# Patient Record
Sex: Female | Born: 1975
Health system: Southern US, Community
[De-identification: ages and names within clinical notes are randomized; demographics above are authoritative.]

## PROBLEM LIST (undated history)

## (undated) DIAGNOSIS — Z9889 Other specified postprocedural states: Secondary | ICD-10-CM

## (undated) DIAGNOSIS — D649 Anemia, unspecified: Secondary | ICD-10-CM

## (undated) DIAGNOSIS — Z974 Presence of external hearing-aid: Secondary | ICD-10-CM

## (undated) DIAGNOSIS — R112 Nausea with vomiting, unspecified: Secondary | ICD-10-CM

## (undated) DIAGNOSIS — N946 Dysmenorrhea, unspecified: Secondary | ICD-10-CM

## (undated) DIAGNOSIS — Z973 Presence of spectacles and contact lenses: Secondary | ICD-10-CM

## (undated) DIAGNOSIS — N819 Female genital prolapse, unspecified: Secondary | ICD-10-CM

## (undated) DIAGNOSIS — Z8489 Family history of other specified conditions: Secondary | ICD-10-CM

## (undated) DIAGNOSIS — G43909 Migraine, unspecified, not intractable, without status migrainosus: Secondary | ICD-10-CM

## (undated) DIAGNOSIS — N920 Excessive and frequent menstruation with regular cycle: Secondary | ICD-10-CM

## (undated) HISTORY — PX: OTHER SURGICAL HISTORY: SHX169

---

## 1999-11-18 ENCOUNTER — Other Ambulatory Visit: Admission: RE | Admit: 1999-11-18 | Discharge: 1999-11-18 | Payer: Self-pay | Admitting: Family Medicine

## 2000-12-31 ENCOUNTER — Other Ambulatory Visit: Admission: RE | Admit: 2000-12-31 | Discharge: 2000-12-31 | Payer: Self-pay | Admitting: Obstetrics and Gynecology

## 2001-03-25 ENCOUNTER — Ambulatory Visit (HOSPITAL_COMMUNITY): Admission: RE | Admit: 2001-03-25 | Discharge: 2001-03-25 | Payer: Self-pay | Admitting: Obstetrics and Gynecology

## 2001-03-25 ENCOUNTER — Encounter: Payer: Self-pay | Admitting: Obstetrics and Gynecology

## 2001-07-13 ENCOUNTER — Inpatient Hospital Stay (HOSPITAL_COMMUNITY): Admission: AD | Admit: 2001-07-13 | Discharge: 2001-07-13 | Payer: Self-pay | Admitting: Obstetrics and Gynecology

## 2001-07-25 ENCOUNTER — Ambulatory Visit (HOSPITAL_COMMUNITY): Admission: RE | Admit: 2001-07-25 | Discharge: 2001-07-25 | Payer: Self-pay | Admitting: Obstetrics and Gynecology

## 2001-08-01 ENCOUNTER — Inpatient Hospital Stay (HOSPITAL_COMMUNITY): Admission: AD | Admit: 2001-08-01 | Discharge: 2001-08-04 | Payer: Self-pay | Admitting: Obstetrics and Gynecology

## 2002-02-17 ENCOUNTER — Other Ambulatory Visit: Admission: RE | Admit: 2002-02-17 | Discharge: 2002-02-17 | Payer: Self-pay | Admitting: *Deleted

## 2003-02-02 ENCOUNTER — Ambulatory Visit (HOSPITAL_COMMUNITY): Admission: RE | Admit: 2003-02-02 | Discharge: 2003-02-02 | Payer: Self-pay | Admitting: Family Medicine

## 2003-02-02 ENCOUNTER — Encounter: Payer: Self-pay | Admitting: Family Medicine

## 2003-02-13 ENCOUNTER — Other Ambulatory Visit: Admission: RE | Admit: 2003-02-13 | Discharge: 2003-02-13 | Payer: Self-pay | Admitting: Obstetrics and Gynecology

## 2003-03-08 ENCOUNTER — Ambulatory Visit (HOSPITAL_COMMUNITY): Admission: RE | Admit: 2003-03-08 | Discharge: 2003-03-08 | Payer: Self-pay | Admitting: Obstetrics and Gynecology

## 2004-03-21 ENCOUNTER — Other Ambulatory Visit: Admission: RE | Admit: 2004-03-21 | Discharge: 2004-03-21 | Payer: Self-pay | Admitting: Family Medicine

## 2007-01-11 ENCOUNTER — Other Ambulatory Visit: Admission: RE | Admit: 2007-01-11 | Discharge: 2007-01-11 | Payer: Self-pay | Admitting: Family Medicine

## 2007-10-30 ENCOUNTER — Emergency Department (HOSPITAL_COMMUNITY): Admission: EM | Admit: 2007-10-30 | Discharge: 2007-10-30 | Payer: Self-pay | Admitting: Emergency Medicine

## 2008-12-12 ENCOUNTER — Emergency Department (HOSPITAL_COMMUNITY): Admission: EM | Admit: 2008-12-12 | Discharge: 2008-12-12 | Payer: Self-pay | Admitting: Emergency Medicine

## 2008-12-24 ENCOUNTER — Other Ambulatory Visit: Admission: RE | Admit: 2008-12-24 | Discharge: 2008-12-24 | Payer: Self-pay | Admitting: Family Medicine

## 2011-01-22 LAB — URINALYSIS, ROUTINE W REFLEX MICROSCOPIC
Bilirubin Urine: NEGATIVE
Glucose, UA: NEGATIVE mg/dL
Hgb urine dipstick: NEGATIVE
Ketones, ur: NEGATIVE mg/dL
Nitrite: NEGATIVE
Protein, ur: NEGATIVE mg/dL
Specific Gravity, Urine: 1.005 (ref 1.005–1.030)
Urobilinogen, UA: 0.2 mg/dL (ref 0.0–1.0)
pH: 7 (ref 5.0–8.0)

## 2011-01-22 LAB — DIFFERENTIAL
Basophils Absolute: 0 10*3/uL (ref 0.0–0.1)
Basophils Relative: 0 % (ref 0–1)
Eosinophils Absolute: 0.1 10*3/uL (ref 0.0–0.7)
Neutrophils Relative %: 74 % (ref 43–77)

## 2011-01-22 LAB — POCT I-STAT, CHEM 8
Creatinine, Ser: 0.9 mg/dL (ref 0.4–1.2)
Glucose, Bld: 91 mg/dL (ref 70–99)
Hemoglobin: 12.9 g/dL (ref 12.0–15.0)
TCO2: 24 mmol/L (ref 0–100)

## 2011-01-22 LAB — URINE MICROSCOPIC-ADD ON

## 2011-01-22 LAB — CBC
MCHC: 33.4 g/dL (ref 30.0–36.0)
MCV: 88.5 fL (ref 78.0–100.0)
Platelets: 314 10*3/uL (ref 150–400)

## 2011-01-22 LAB — POCT CARDIAC MARKERS: Myoglobin, poc: 55.5 ng/mL (ref 12–200)

## 2011-02-27 NOTE — Op Note (Signed)
NAME:  April Sutton, April Sutton                         ACCOUNT NO.:  [id number]   MEDICAL RECORD NO.:  [id number]                   PATIENT TYPE:  AMB   LOCATION:  SDC                                  FACILITY:  [facility]   PHYSICIAN:  Miguel Aschoff, M.D.                    DATE OF BIRTH:  20-May-1976   DATE OF PROCEDURE:  03/08/2003  DATE OF DISCHARGE:                                 OPERATIVE REPORT   PREOPERATIVE DIAGNOSIS:  Secondary dysmenorrhea.   POSTOPERATIVE DIAGNOSIS:  Pelvic endometriosis.   PROCEDURE:  Diagnostic laparoscopy with laser ablation of endometriosis and  laser uterosacral nerve ablation.   SURGEON:  Miguel Aschoff, M.D.   ANESTHESIA:  General.   COMPLICATIONS:  None.   JUSTIFICATION:  The patient is a 35 year old white female with a history of  secondary dysmenorrhea getting progressively worse, which has not responded  to medical therapy.  In view of failure of medical therapy and a desire for  a diagnosis, the patient presents now to undergo laparoscopy to see if an  etiology for the pain can be established and corrected.  The risks and  benefits of the procedure were discussed with the patient.   DESCRIPTION OF PROCEDURE:  The patient was taken to the operating room and  placed in a supine position.  General anesthesia was administered without  difficulty.  She was then placed in the dorsal lithotomy position, prepped  and draped in the usual sterile fashion.  The bladder was catheterized, a  Hulka tenaculum was placed through the cervix and held.  Once this was done  a small infraumbilical incision was made, a Veress needle was inserted, and  the abdomen was insufflated with 3 L of CO2.  Following the insufflation the  trocar for the laparoscope was placed, followed by the laparoscope itself.  Under direct visualization a 5 mm port was then established suprapubically.  Systematic inspection of pelvic organs showed the anterior bladder  peritoneum to be unremarkable.   The uterus was noted to be anterior, normal  size and shape, and unremarkable.  The round ligaments were inspected and  were totally within normal limits.  the tubes were traced out to their  fimbriated ends.  The tubes were normal in their course, the fimbriae were  fine and delicate.  The ovaries were inspected and were noted to be totally  within normal limits bilaterally.  No cysts or lesions were noted within the  ovary.  Inspection of the cul-de-sac revealed one pigmented characteristic  lesion of endometriosis just beyond the right uterosacral ligament; however,  in the cul-de-sac there were multiple white plaques and scarring also  consistent with nonpigmented endometriosis.  The intestinal surfaces  appeared unremarkable.  The liver was inspected and noted to be within  normal limits, and no other abnormalities were noted in the pelvis or  abdomen.  At this point in an  effort to decrease the patient's pain, a YAG  fiber with a GRP6 YAG tip was placed through the operating channel of the  laparoscope and then the uterosacral ligaments were partially transected in  an effort to decrease the dysmenorrhea.  Care was taken to avoid any injury  to the ureter or any blood vessels.  The pigmented endometrial lesion was  treated with the YAG laser without difficulty.  Once this was done and there  was good hemostasis and no other abnormalities were noted, it was elected to  complete the  procedure.  The CO2 was allowed to escape, all instruments  were removed.  The small incisions were closed using subcuticular 4-0  Vicryl.  The port sites were injected with 0.5% Marcaine.  The patient was  reversed from the anesthetic and taken to the recovery room in satisfactory  condition.  The estimated blood loss was less than 10 mL.   The plan is for the patient to return to the office in four weeks for follow-  up examination.  She is to call if there are any problems such as fever,  pain, or heavy  bleeding.  Medications for home include Tylox one every three  hours as needed for pain, doxycycline 100 mg twice a day x3 days.  She is to  call if there are any problems such as fever, pain, or heavy bleeding, and  was instructed to refrain from intercourse for two weeks.                                               Miguel Aschoff, M.D.    AR/MEDQ  D:  03/08/2003  T:  03/08/2003  Job:  [id number]

## 2011-02-27 NOTE — H&P (Signed)
[facility]  Patient:    April Sutton, April Sutton Visit Number: [id number] MRN: [id number]          Service Type: OBS Location: 910B 9160 01 Attending Physician:  Shaune Spittle Dictated by:   Saverio Danker, C.N.M. Admit Date:  08/01/2001                           History and Physical  HISTORY:                      Ms. Strickland is a 35 year old married white female gravida 3, para 2-0-0-2 at [redacted] weeks gestation by ultrasound who presents for induction of labor secondary to being post dates and having significant left leg swelling.  She denies any nausea, vomiting, headaches, or visual disturbances.  She denies any leaking or vaginal bleeding.  She actually currently denies any uterine contractions.  Her pregnancy has been followed by [facility] by the certified nurse midwife service and has been essentially uncomplicated, though at risk for being post dates, questionable LMP, first trimester spotting, group B Strep negative.  PAST OBSTETRICAL HISTORY:     She is a gravida 3, para 2-0-0-2 who delivered a viable female infant in August 1995 who weighed 7 pounds 0 ounces at [redacted] weeks gestation following a 36 hour labor.  That was an infant named Paramedic.  In December 1996 she delivered a viable female infant who weighed 8 pounds 8 ounces at [redacted] weeks gestation following a four and a half hour labor, also vaginally with no complications.  That infants name is Dedrick.  ALLERGIES:                    No known drug allergies.  PAST MEDICAL HISTORY:         She reports having had the usual childhood diseases.  She reports occasional urinary tract infections and had a kidney infection that was treated with p.o. antibiotics.  She has a history of a seizure secondary to increased temperature as a child and stress related migraines.  FAMILY HISTORY:               Significant for maternal grandmother with MI, paternal grandmother with  varicosities, maternal grandmother with insulin-dependent diabetes now deceased, maternal grandfather with stroke and cancer, paternal grandfather and maternal grandmother with CVA.  GENETIC HISTORY:              Significant only for patients maternal second cousin with Down syndrome and father of the babys uncle had some kind of heart problem.  SOCIAL HISTORY:               She is married to [employer] who is involved and supportive.  He is employed full-time as a Nature conservation officer.  She is employed full-time as a Diplomatic Services operational officer.  They are of the [place] faith.  They deny any illicit drug use, alcohol, or smoking with this pregnancy.  PRENATAL LABORATORIES:        Blood type A+.  Antibody screen is negative. Syphilis is nonreactive.  Rubella is immune.  Hepatitis B surface antigen is negative.  HIV is nonreactive.  Pap is within normal limits.  One hour glucola is within normal range.  Maternal serum alpha fetoprotein is also within normal range.  Her 36 week beta Strep was negative.  She had an ultrasound at approximately 38 weeks that gave estimated fetal weight of  82-87th percentile growth.  PHYSICAL EXAMINATION  VITAL SIGNS:                  Stable.  She is afebrile.  HEENT:                        Grossly within normal limits.  HEART:                        Regular rhythm and rate.  CHEST:                        Clear.  BREASTS:                      Soft and nontender.  ABDOMEN:                      Gravid with uterine contractions every five minutes that are mild.  Fetal heart rate is reassuring.  PELVIC:                       Cervix is 1 cm, 50%, vertex, -2/-3, very posterior.  EXTREMITIES:                  Left leg has 2+ edema, right leg has 1+ edema.  ASSESSMENT:                   1. Intrauterine pregnancy at 41 weeks for                                  induction of labor.                               2. Negative group B Strep.  PLAN:                         In  consultation with Dr. Dierdre Forth to start her on Pitocin throughout the day.  If she has no significant progress by this evening, will plan Cervidil overnight. Dictated by:   Vance Gather Duplantis, C.N.M. Attending Physician:  Shaune Spittle DD:  08/01/01 TD:  08/01/01 Job: [id number]

## 2011-03-25 ENCOUNTER — Encounter (INDEPENDENT_AMBULATORY_CARE_PROVIDER_SITE_OTHER): Payer: 59 | Admitting: Obstetrics & Gynecology

## 2011-03-25 ENCOUNTER — Other Ambulatory Visit: Payer: Self-pay | Admitting: Obstetrics & Gynecology

## 2011-03-25 DIAGNOSIS — R102 Pelvic and perineal pain: Secondary | ICD-10-CM

## 2011-03-25 DIAGNOSIS — Z01419 Encounter for gynecological examination (general) (routine) without abnormal findings: Secondary | ICD-10-CM

## 2011-03-26 NOTE — Assessment & Plan Note (Unsigned)
April Sutton, April Sutton               ACCOUNT NO.:  [id number]  MEDICAL RECORD NO.:  [id number]            PATIENT TYPE:  LOCATION:  [facility] [place]           FACILITY:  PHYSICIAN:  Allie Bossier, MD             DATE OF BIRTH:  DATE OF SERVICE:  03/25/2011                                 CLINIC NOTE  HISTORY OF PRESENT ILLNESS:  April Sutton is a 35 year old married white G3, P3, she has a 63 year old daughter, 80 year old son, and a 9-year- old daughter and she is here as a new patient exam complaining of pelvic pain and the pain is significant all during the month, but especially bad with her period.  She tells me she had a laparoscopy in 2004 and was told that she has endometriosis.  She said her pain was fairly well controlled until about 6 months ago at which time it became worse.  She complains of an itchy vagina, but was checked recently.  Her primary care doctor told she did not have a yeast infection.  PAST MEDICAL HISTORY: 1. Migraines. 2. Overweight. 3. Endometriosis.  REVIEW OF SYSTEMS:  She will have about 10 to 12 periods a year.  Her husband is status post vasectomy.  She had been married for 16 years. Her primary care MD is at [facility] and she does report dysmenorrhea and dyspareunia.  Her Pap smears have always been normal. Her last was done in 2011 and she understands that she will need another one for 2 more years.  PAST SURGICAL HISTORY:  Laparoscopy and excision of endometriosis in 2004 (I will try to obtain these records).  FAMILY HISTORY:  Significant for colon cancer in a 36 year old maternal cousin, significant for brain cancer in a 36 year old paternal cousin, and significant for breast cancer in a maternal aunt.  She denies family history of GYN cancer.  She has LATEX allergies.  No known drug allergies.  SOCIAL HISTORY:  Negative for tobacco, alcohol, or drug use.  MEDICATIONS:  Butal/Fioricet on a p.r.n. basis for her migraine.  She takes  ibuprofen for her pelvic pain on a p.r.n. basis and she takes B pollen on a daily basis (over-the-counter supplement).  PHYSICAL EXAMINATION:  GENERAL:  Well-nourished, well-hydrated pleasant white female, height 5 feet 3 inches, weight 180, blood pressure 119/75, pulse 92. HEENT:  Normal. BREASTS:  Normal bilaterally. HEART:  Regular rate and rhythm. LUNGS:  Clear to auscultation bilaterally. ABDOMEN:  Obese, benign. EXTERNAL GENITALIA:  No lesions.  Normal discharge.  Normal vagina. Normal vulva.  Cervix appears parous, but no lesions.  Uterus is mid plane, is upper limit of normal size to 6 weeks' size.  It is relatively mobile and not particularly tender.  Adnexa nonenlarged and nontender.  ASSESSMENT/PLAN: 1. Annual exam.  She does not need a Pap smear at this point, but I     recommend self-breast and self-vulvar exams. 2. Increasing pelvic pain and dyspareunia and slightly enlarged     uterus.  Her pain maybe due to return of her endometriosis, but I     also would like to rule out a fibroid, so I am ordering an  ultrasound.  I have checked cervical cultures and I will see her     back when her ultrasound is finished.     Allie Bossier, MD    MCD/MEDQ  D:  03/25/2011  T:  03/26/2011  Job:  [id number]

## 2011-03-27 ENCOUNTER — Ambulatory Visit (HOSPITAL_COMMUNITY)
Admission: RE | Admit: 2011-03-27 | Discharge: 2011-03-27 | Disposition: A | Discharge status: Home / Self Care | Payer: 59 | Source: Ambulatory Visit | Attending: Obstetrics & Gynecology | Admitting: Obstetrics & Gynecology

## 2011-03-27 ENCOUNTER — Other Ambulatory Visit (HOSPITAL_COMMUNITY): Payer: 59

## 2011-03-27 DIAGNOSIS — N949 Unspecified condition associated with female genital organs and menstrual cycle: Secondary | ICD-10-CM | POA: Insufficient documentation

## 2011-03-27 DIAGNOSIS — R102 Pelvic and perineal pain: Secondary | ICD-10-CM

## 2011-04-22 ENCOUNTER — Ambulatory Visit: Payer: 59 | Admitting: Obstetrics & Gynecology

## 2011-04-23 ENCOUNTER — Telehealth: Payer: Self-pay | Admitting: *Deleted

## 2011-04-23 ENCOUNTER — Encounter: Payer: Self-pay | Admitting: *Deleted

## 2011-04-23 NOTE — Telephone Encounter (Signed)
Pt called requesting results of U/S results.

## 2011-04-23 NOTE — Telephone Encounter (Signed)
Spoke with pt and let her know that U/S was normal but she needed to sch appt with Dr Marice Potter to re-evaluate problem and make a plan of care.  Pt agrres

## 2011-04-28 ENCOUNTER — Encounter: Payer: Self-pay | Admitting: Obstetrics & Gynecology

## 2011-04-28 ENCOUNTER — Ambulatory Visit (INDEPENDENT_AMBULATORY_CARE_PROVIDER_SITE_OTHER): Payer: 59 | Admitting: Obstetrics & Gynecology

## 2011-04-28 VITALS — BP 132/81 | HR 92 | Temp 97.8°F | Resp 16 | Ht 63.0 in | Wt 177.0 lb

## 2011-04-28 DIAGNOSIS — R32 Unspecified urinary incontinence: Secondary | ICD-10-CM

## 2011-04-28 DIAGNOSIS — N949 Unspecified condition associated with female genital organs and menstrual cycle: Secondary | ICD-10-CM

## 2011-04-28 DIAGNOSIS — R102 Pelvic and perineal pain unspecified side: Secondary | ICD-10-CM

## 2011-04-28 NOTE — Progress Notes (Signed)
  Subjective:    Patient ID: April Sutton, female    DOB: [birth year], 35 y.o.   MRN: [id number]  HPI Pt is a 35 year old G3P3 female with a month history of pelvic pain.  Pt has dysmenorrhea and dyspaurenia.  The pt states she was diagnosed with endometriosis in 2004 by laparoscopy and fulguration of the endometriosis.  Pain was well controlled until Dec 2011.  Pain is mostly constant across abdomen and worse with menses.  Pt does have frequent urination and voids approx every 1-2 hrs.  Pt fluid restricts on trips and during movies. Pt voids approx 1-2 times per night.  There is discomfort to a full bladder 4-5 times a day.  Pt has 2 BMs a day which a soft and nonpainful.  Pt has been on OCPs for one month with little improvement in pain.  We discussed continual OCPs which she will try.  Pt may need a monophasic pill for better cycle control.  Pt has a nml [place] from last month.  Pt needs to sign record release for surgical reports from 2004    Review of Systems  Constitutional: Negative.   Respiratory: Negative.   Cardiovascular: Negative.   Gastrointestinal:       Pain across both lower quadrants  Genitourinary: Negative.   Psychiatric/Behavioral: Negative for agitation.       Objective:   Physical Exam  Constitutional: She appears well-developed and well-nourished.  HENT:  Head: Normocephalic.  Pulmonary/Chest: Effort normal.  Skin: Skin is warm and dry.  Psychiatric: She has a normal mood and affect. Her behavior is normal.      Assessment & Plan:  Pt is a 35 y.o. Female with 6 months of pelvic pain, dysmenorrhea, and dyspaurenia and dx with endometriosis 8 yrs ago.  Pt also has urinary incont.  ?mixed incontinence.  1.  Cont OCPs.  May need monphasic pills if pt skips placebo.  If pain not better, may switch to aromatase inhibitor and new prog.  2.  Urinary incont--voiding diary for 2 days, including intake.  Referral to PT for incont and pelvci pain.  3.  Obtain 2004 op  note.  4.  RTC in 2-3 months.

## 2011-04-28 NOTE — Patient Instructions (Signed)
Take birth control continuously.  Do not take placebo pills. Voiding dairy for 2 days.  Write down everything you drink and when you void.  Include amts and times. Physical Therapy for incontinence Release of information Breast Cancer gene testing.

## 2011-04-30 ENCOUNTER — Encounter: Payer: Self-pay | Admitting: Obstetrics & Gynecology

## 2011-06-17 ENCOUNTER — Ambulatory Visit: Payer: 59 | Admitting: Obstetrics & Gynecology

## 2011-06-24 ENCOUNTER — Encounter: Payer: Self-pay | Admitting: Obstetrics & Gynecology

## 2011-06-24 ENCOUNTER — Ambulatory Visit (INDEPENDENT_AMBULATORY_CARE_PROVIDER_SITE_OTHER): Payer: 59 | Admitting: Obstetrics & Gynecology

## 2011-06-24 VITALS — BP 134/80 | HR 80 | Temp 98.6°F | Resp 16 | Ht 63.0 in | Wt 182.0 lb

## 2011-06-24 DIAGNOSIS — R102 Pelvic and perineal pain: Secondary | ICD-10-CM

## 2011-06-24 DIAGNOSIS — N949 Unspecified condition associated with female genital organs and menstrual cycle: Secondary | ICD-10-CM

## 2011-06-24 DIAGNOSIS — R32 Unspecified urinary incontinence: Secondary | ICD-10-CM

## 2011-06-24 DIAGNOSIS — N9089 Other specified noninflammatory disorders of vulva and perineum: Secondary | ICD-10-CM | POA: Insufficient documentation

## 2011-06-24 DIAGNOSIS — N809 Endometriosis, unspecified: Secondary | ICD-10-CM | POA: Insufficient documentation

## 2011-06-24 LAB — POCT URINALYSIS DIPSTICK
Blood, UA: NEGATIVE
Glucose, UA: NEGATIVE
Spec Grav, UA: 1.005
Urobilinogen, UA: NORMAL

## 2011-06-24 MED ORDER — LEVONORGEST-ETH ESTRAD 91-DAY 0.15-0.03 &0.01 MG PO TABS: 1.0000 | ORAL_TABLET | Freq: Every day | ORAL | Status: DC

## 2011-06-24 NOTE — Progress Notes (Signed)
  Subjective:    Patient ID: April Sutton, female    DOB: October 09, 1976, 35 y.o.   MRN: [id number]  HPI Pts pain better on OCPs but having spotting with tripashic continuous.  Pt also brought in voiding diary.  Most voids are 300+.  There are a few that are close together and only 50.  Pt drinks almost 80 cc H2O a day.  Pt has had 3 bought of vaginal irriation and felt bumps.  No history of herpes.  In monogamous relationship.   Review of Systems  Constitutional: Negative.   Respiratory: Negative.   Cardiovascular: Negative.   Genitourinary: Positive for menstrual problem and pelvic pain.       Urinary frequency  Neurological: Negative.   Psychiatric/Behavioral: Negative.        Objective:   Physical Exam  Constitutional: She appears well-developed and well-nourished.  HENT:  Head: Normocephalic and atraumatic.  Neck: Neck supple.  Abdominal: Soft.  Genitourinary:       Redness between labia majora and labia minora.  No lesion.  Skin: Skin is warm and dry.          Assessment & Plan:  1.  Endometriosis:  Change to seasonique.  Triphasic pill causing spotting. 2.  If pain continues, will try aromatase inhibitor. 3.  Vulvar irritation: Vular hygiene reviewed.  RTC when worse.  Will,consider HSV testing.  Clip pubic hair at this time (not shave)

## 2011-06-25 ENCOUNTER — Telehealth: Payer: Self-pay | Admitting: *Deleted

## 2011-06-25 MED ORDER — NORGESTIMATE-ETH ESTRADIOL 0.25-35 MG-MCG PO TABS: 1.0000 | ORAL_TABLET | Freq: Every day | ORAL | Status: DC

## 2011-06-25 NOTE — Telephone Encounter (Signed)
Spoke with Dr. Penne Lash via phone and explained that pt wanted to change OCP to something cheaper because her co-pay was so expensive.  Per Dr. Penne Lash call in to [facility] Cyclen to take one pill every day continues, disregarding the placebo pills.

## 2011-07-01 ENCOUNTER — Telehealth: Payer: Self-pay | Admitting: *Deleted

## 2011-07-01 NOTE — Telephone Encounter (Signed)
Pt called saying that even with a generic of Ortho Cyclen the co-pay was too expensive.  I told her to call [facility] and check on their price.  She did and considerably lower co-pay so the original prescription for Ortho Cyclen was changed to [facility] [address].

## 2011-07-03 LAB — URINALYSIS, ROUTINE W REFLEX MICROSCOPIC
Bilirubin Urine: NEGATIVE
Nitrite: NEGATIVE
Specific Gravity, Urine: 1.003 — ABNORMAL LOW
pH: 7

## 2011-07-03 LAB — COMPREHENSIVE METABOLIC PANEL
ALT: 17
Alkaline Phosphatase: 66
CO2: 23
Chloride: 102
GFR calc non Af Amer: >60
Glucose, Bld: 84
Potassium: 3.7
Sodium: 133 — ABNORMAL LOW
Total Bilirubin: 0.4

## 2011-07-03 LAB — D-DIMER, QUANTITATIVE: D-Dimer, Quant: 0.4

## 2011-07-03 LAB — DIFFERENTIAL
Basophils Relative: 1
Eosinophils Absolute: 0.1
Neutrophils Relative %: 64

## 2011-07-03 LAB — LIPASE, BLOOD: Lipase: 20

## 2011-07-03 LAB — CBC
Hemoglobin: 12.9
MCHC: 33.4
RBC: 4.29

## 2011-07-03 LAB — POCT PREGNANCY, URINE: Operator id: 26520

## 2012-01-25 ENCOUNTER — Other Ambulatory Visit: Payer: Self-pay | Admitting: Gastroenterology

## 2012-01-27 ENCOUNTER — Ambulatory Visit
Admission: RE | Admit: 2012-01-27 | Discharge: 2012-01-27 | Disposition: A | Discharge status: Home / Self Care | Payer: 59 | Source: Ambulatory Visit | Attending: Gastroenterology | Admitting: Gastroenterology

## 2012-06-30 ENCOUNTER — Other Ambulatory Visit (HOSPITAL_COMMUNITY): Payer: Self-pay | Admitting: Internal Medicine

## 2012-06-30 ENCOUNTER — Ambulatory Visit (HOSPITAL_COMMUNITY)
Admission: RE | Admit: 2012-06-30 | Discharge: 2012-06-30 | Disposition: A | Discharge status: Home / Self Care | Payer: 59 | Source: Ambulatory Visit | Attending: Internal Medicine | Admitting: Internal Medicine

## 2012-06-30 DIAGNOSIS — R079 Chest pain, unspecified: Secondary | ICD-10-CM

## 2013-03-26 ENCOUNTER — Emergency Department (HOSPITAL_COMMUNITY)
Admission: EM | Admit: 2013-03-26 | Discharge: 2013-03-26 | Disposition: A | Discharge status: Home / Self Care | Payer: 59 | Attending: Emergency Medicine | Admitting: Emergency Medicine

## 2013-03-26 ENCOUNTER — Encounter (HOSPITAL_COMMUNITY): Payer: Self-pay | Admitting: *Deleted

## 2013-03-26 DIAGNOSIS — R110 Nausea: Secondary | ICD-10-CM | POA: Insufficient documentation

## 2013-03-26 DIAGNOSIS — K921 Melena: Secondary | ICD-10-CM | POA: Insufficient documentation

## 2013-03-26 DIAGNOSIS — Z8742 Personal history of other diseases of the female genital tract: Secondary | ICD-10-CM | POA: Insufficient documentation

## 2013-03-26 DIAGNOSIS — K529 Noninfective gastroenteritis and colitis, unspecified: Secondary | ICD-10-CM

## 2013-03-26 DIAGNOSIS — N809 Endometriosis, unspecified: Secondary | ICD-10-CM

## 2013-03-26 DIAGNOSIS — E860 Dehydration: Secondary | ICD-10-CM | POA: Insufficient documentation

## 2013-03-26 DIAGNOSIS — R197 Diarrhea, unspecified: Secondary | ICD-10-CM | POA: Insufficient documentation

## 2013-03-26 DIAGNOSIS — Z791 Long term (current) use of non-steroidal anti-inflammatories (NSAID): Secondary | ICD-10-CM | POA: Insufficient documentation

## 2013-03-26 DIAGNOSIS — K5289 Other specified noninfective gastroenteritis and colitis: Secondary | ICD-10-CM | POA: Insufficient documentation

## 2013-03-26 DIAGNOSIS — Z3202 Encounter for pregnancy test, result negative: Secondary | ICD-10-CM | POA: Insufficient documentation

## 2013-03-26 DIAGNOSIS — R6883 Chills (without fever): Secondary | ICD-10-CM | POA: Insufficient documentation

## 2013-03-26 DIAGNOSIS — Z79899 Other long term (current) drug therapy: Secondary | ICD-10-CM | POA: Insufficient documentation

## 2013-03-26 LAB — URINALYSIS, ROUTINE W REFLEX MICROSCOPIC
Bilirubin Urine: NEGATIVE
Glucose, UA: NEGATIVE mg/dL
Hgb urine dipstick: NEGATIVE
Specific Gravity, Urine: 1.018 (ref 1.005–1.030)
pH: 5 (ref 5.0–8.0)

## 2013-03-26 LAB — CBC WITH DIFFERENTIAL/PLATELET
Basophils Absolute: 0 10*3/uL (ref 0.0–0.1)
Eosinophils Absolute: 0.1 10*3/uL (ref 0.0–0.7)
Eosinophils Relative: 0 % (ref 0–5)
Lymphocytes Relative: 7 % — ABNORMAL LOW (ref 12–46)
Lymphs Abs: 1.1 10*3/uL (ref 0.7–4.0)
MCH: 27.5 pg (ref 26.0–34.0)
Neutrophils Relative %: 87 % — ABNORMAL HIGH (ref 43–77)
Platelets: 299 10*3/uL (ref 150–400)
RBC: 4.47 MIL/uL (ref 3.87–5.11)
RDW: 12.6 % (ref 11.5–15.5)
WBC: 16.6 10*3/uL — ABNORMAL HIGH (ref 4.0–10.5)

## 2013-03-26 LAB — COMPREHENSIVE METABOLIC PANEL
ALT: 16 U/L (ref 0–35)
AST: 17 U/L (ref 0–37)
Alkaline Phosphatase: 79 U/L (ref 39–117)
Calcium: 9.4 mg/dL (ref 8.4–10.5)
GFR calc Af Amer: >90 mL/min (ref >90)
Glucose, Bld: 96 mg/dL (ref 70–99)
Potassium: 4 mEq/L (ref 3.5–5.1)
Sodium: 136 mEq/L (ref 135–145)
Total Protein: 7.6 g/dL (ref 6.0–8.3)

## 2013-03-26 LAB — PROTIME-INR
INR: 0.94 (ref 0.00–1.49)
Prothrombin Time: 12.5 seconds (ref 11.6–15.2)

## 2013-03-26 LAB — OCCULT BLOOD, POC DEVICE: Fecal Occult Bld: POSITIVE — AB

## 2013-03-26 LAB — POCT PREGNANCY, URINE: Preg Test, Ur: NEGATIVE

## 2013-03-26 LAB — APTT: aPTT: 30 seconds (ref 24–37)

## 2013-03-26 MED ORDER — CIPROFLOXACIN HCL 500 MG PO TABS: 500.0000 mg | ORAL_TABLET | Freq: Two times a day (BID) | ORAL | Status: DC

## 2013-03-26 MED ORDER — SODIUM CHLORIDE 0.9 % IV BOLUS (SEPSIS)
1000.0000 mL | Freq: Once | INTRAVENOUS | Status: AC
  Administered 2013-03-26: 1000 mL via INTRAVENOUS

## 2013-03-26 MED ORDER — ONDANSETRON HCL 4 MG/2ML IJ SOLN
4.0000 mg | Freq: Once | INTRAMUSCULAR | Status: AC
  Administered 2013-03-26: 4 mg via INTRAVENOUS
  Filled 2013-03-26: qty 2

## 2013-03-26 MED ORDER — ONDANSETRON HCL 4 MG PO TABS: 4.0000 mg | ORAL_TABLET | Freq: Four times a day (QID) | ORAL | Status: DC

## 2013-03-26 MED ORDER — HYDROMORPHONE HCL PF 1 MG/ML IJ SOLN
0.5000 mg | Freq: Once | INTRAMUSCULAR | Status: AC
  Administered 2013-03-26: 0.5 mg via INTRAVENOUS
  Filled 2013-03-26: qty 1

## 2013-03-26 NOTE — ED Provider Notes (Signed)
History     CSN: [id number]  Arrival date & time 03/26/13  1238   First MD Initiated Contact with Patient 03/26/13 1510      Chief Complaint  Patient presents with  . Abdominal Pain    LLQ    (Consider location/radiation/quality/duration/timing/severity/associated sxs/prior treatment) The history is provided by the patient. No language interpreter was used.  April Sutton is a 37 y/o F with PMHx of endometriosis presenting to the ED with sudden onset of abdominal pain localized to the LLQ that began this morning at around 10AM this morning, described as a sharp shooting pain that is constant with radiation to the back. Patient reported having diarrhea that began at approximately 5AM this morning - patient reported that around 10AM the diarrhea turned into bright red blood - patient stated that every time she goes to the bathroom there is bright red blood when she wipes and in the toilet. Stated that she has been having some mld chills and nausea. Denied vomiting, NSAID use, history of diverticulitis, history of gi bleeds, history of diverticulitis, blurred vision, headaches, chest pain, shortness of breath, difficulty breathing, dysuria.  Denied having cholecystectomy and appendectomy. PCP: Dr. Linton [facility]ump [facility]   Past Medical History  Diagnosis Date  . Endometriosis     Past Surgical History  Procedure Laterality Date  . Laparoscopic endometriosis fulguration      Partial transection of uteral sacral nerve    Family History  Problem Relation Age of Onset  . Heart attack Father   . Heart disease Father   . Diabetes Maternal Grandmother     History  Substance Use Topics  . Smoking status: Never Smoker   . Smokeless tobacco: Not on file  . Alcohol Use: No    OB History   Grav Para Term Preterm Abortions TAB SAB Ect Mult Living   3 3        3       Review of Systems  Constitutional: Positive for chills. Negative for fever.  HENT: Negative for trouble swallowing  and neck stiffness.   Eyes: Negative for pain and visual disturbance.  Respiratory: Negative for chest tightness and shortness of breath.   Cardiovascular: Negative for chest pain.  Gastrointestinal: Positive for nausea, abdominal pain, diarrhea, blood in stool and anal bleeding. Negative for vomiting.  Genitourinary: Negative for dysuria, vaginal bleeding, vaginal discharge, difficulty urinating and vaginal pain.  Musculoskeletal: Positive for back pain.  Neurological: Negative for dizziness, weakness, light-headedness and numbness.  All other systems reviewed and are negative.    Allergies  Latex  Home Medications   Current Outpatient Rx  Name  Route  Sig  Dispense  Refill  . bismuth subsalicylate (PEPTO BISMOL) 262 MG/15ML suspension   Oral   Take 15 mLs by mouth every 6 (six) hours as needed for indigestion.         . dimenhyDRINATE (DRAMAMINE) 50 MG tablet   Oral   Take 50 mg by mouth every 8 (eight) hours as needed.         . naproxen sodium (ANAPROX) 220 MG tablet   Oral   Take 220 mg by mouth 2 (two) times daily with a meal.         . OVER THE COUNTER MEDICATION      1 capsule daily. LIPOZENE         . ciprofloxacin (CIPRO) 500 MG tablet   Oral   Take 1 tablet (500 mg total) by mouth 2 (two)  times daily. One po bid x 7 days   14 tablet   0   . ondansetron (ZOFRAN) 4 MG tablet   Oral   Take 1 tablet (4 mg total) by mouth every 6 (six) hours.   12 tablet   0     BP 124/68  Pulse 76  Temp(Src) 98.4 F (36.9 C) (Oral)  Resp 18  SpO2 100%  LMP 03/12/2013  Physical Exam  Nursing note and vitals reviewed. Constitutional: She is oriented to person, place, and time. She appears well-developed and well-nourished. No distress.  HENT:  Head: Normocephalic and atraumatic.  Dry mucus membranes   Eyes: Conjunctivae and EOM are normal. Pupils are equal, round, and reactive to light. Right eye exhibits no discharge. Left eye exhibits no discharge.    Neck: Normal range of motion. Neck supple.  Cardiovascular: Normal rate, regular rhythm and normal heart sounds.  Exam reveals no friction rub.   No murmur heard. Pulses:      Radial pulses are 2+ on the right side, and 2+ on the left side.       Dorsalis pedis pulses are 2+ on the right side, and 2+ on the left side.  Pulmonary/Chest: Effort normal and breath sounds normal. No respiratory distress. She has no wheezes. She has no rales.  Abdominal: Soft. Bowel sounds are normal. She exhibits no distension. There is tenderness in the right lower quadrant and left lower quadrant. There is no rebound, no guarding, no tenderness at McBurney's point and negative Murphy's sign.    Positive Psaos  Positive Obtruator Pain upon palpation mainly to the LLQ  Genitourinary:  Rectal exam: Negative lesions, sores, masses noted to the anus. Negative masses palpated. Negative blood on glove noted. Negative stool on glove noted.    Lymphadenopathy:    She has no cervical adenopathy.  Neurological: She is alert and oriented to person, place, and time. She exhibits normal muscle tone. Coordination normal.  Skin: Skin is warm and dry. No rash noted. She is not diaphoretic. No erythema.  Psychiatric: She has a normal mood and affect. Her behavior is normal. Thought content normal.    ED Course  Procedures (including critical care time)  5:42PM Checked up on patient, reported abdominal discomfort - ordered pain medications for comfort. PO challenge ordered.   6:20PM Patient reported feeling nauseous - Zofran ordered.  7:40PM Patient re-assessed - patient doing well and PO challenge of saltine crackers and gingerale were tolerated. Stated that she is hungry and wants to eat. Discussed discharge plan.   Labs Reviewed  CBC WITH DIFFERENTIAL - Abnormal; Notable for the following:    WBC 16.6 (*)    Neutrophils Relative % 87 (*)    Neutro Abs 14.4 (*)    Lymphocytes Relative 7 (*)    All other components  within normal limits  COMPREHENSIVE METABOLIC PANEL - Abnormal; Notable for the following:    Total Bilirubin 0.2 (*)    All other components within normal limits  OCCULT BLOOD, POC DEVICE - Abnormal; Notable for the following:    Fecal Occult Bld POSITIVE (*)    All other components within normal limits  LIPASE, BLOOD  URINALYSIS, ROUTINE W REFLEX MICROSCOPIC  PROTIME-INR  APTT  POCT PREGNANCY, URINE   No results found.   1. Colitis   2. Endometriosis       MDM  Patient presenting with bloody diarrhea and LLQ abdominal pain that started today.  Appears to be dehydrated Mildly elevated WBC (  16.6). Negative drop in Hgb and Hct - proper level. CMP negative findings. Lipase negative findings. Negative urine pregnancy. Negative findings to INR and aPTT. Urine negative findings. Positive fecal occult. Discussed case with Dr. Dorris Carnes. [place] - stated that she does not need imaging at this time due to patient being hemodynamically stable. Patient tolerated PO challenge - stated that she is feeling better. Patient stable, afebrile - negative drop in Hgb and Hct with positive occult - stable throughout stay. Discussed case and findings with Dr. Dorris Carnes. [place] - recommended patient to be discharged with antibiotics. Discharged patient. Suspicion for colitis with bloody diarrhea and LLQ abdominal pain. Discharged patient with antibiotics and antiemetics - discussed course with patient. Discussed diet. Discussed with patient to continue to monitor her symptoms and if symptoms are to worsen or change to report back to the ED - strict return instructions given to patient if bloody diarrhea is to continue she is to return to the ED.  Patient agreed to plan of care, understood, all questions answered.   Raymon Mutton, PA-C 03/27/13 [phone]

## 2013-03-26 NOTE — ED Notes (Addendum)
Pt states she began having LLQ pain 2 hours ago.  Pt states she began having diarrhea prior to the onset of pain and has had 5 stools with bright red blood since this morning.  Pt has seen GI doctor in September 2013 to be tested for Celiac Disease.  Pt was negative.  Pt was dx with IBS.  Pt endorses nausea, but denies vomiting.  Pt denies pain elsewhere.  Pt has taken Dramamine and Pepto Bismol this morning and that has helped nausea.

## 2013-03-28 NOTE — ED Provider Notes (Signed)
Medical screening examination/treatment/procedure(s) were performed by non-physician practitioner and as supervising physician I was immediately available for consultation/collaboration.  Kattleya Kuhnert R. Senai Ramnath, MD 03/28/13 2320 

## 2014-01-24 ENCOUNTER — Other Ambulatory Visit: Payer: Self-pay | Admitting: Otolaryngology

## 2014-01-24 DIAGNOSIS — H9190 Unspecified hearing loss, unspecified ear: Secondary | ICD-10-CM

## 2014-01-30 ENCOUNTER — Ambulatory Visit
Admission: RE | Admit: 2014-01-30 | Discharge: 2014-01-30 | Disposition: A | Discharge status: Home / Self Care | Payer: 59 | Source: Ambulatory Visit | Attending: Otolaryngology | Admitting: Otolaryngology

## 2014-01-30 DIAGNOSIS — H9190 Unspecified hearing loss, unspecified ear: Secondary | ICD-10-CM

## 2014-08-13 ENCOUNTER — Encounter (HOSPITAL_COMMUNITY): Payer: Self-pay | Admitting: *Deleted

## 2014-10-01 ENCOUNTER — Other Ambulatory Visit: Payer: Self-pay | Admitting: Family Medicine

## 2014-10-01 ENCOUNTER — Other Ambulatory Visit (HOSPITAL_COMMUNITY)
Admission: RE | Admit: 2014-10-01 | Discharge: 2014-10-01 | Disposition: A | Discharge status: Home / Self Care | Payer: 59 | Source: Ambulatory Visit | Attending: Family Medicine | Admitting: Family Medicine

## 2014-10-01 DIAGNOSIS — Z01419 Encounter for gynecological examination (general) (routine) without abnormal findings: Secondary | ICD-10-CM | POA: Diagnosis not present

## 2014-10-03 LAB — CYTOLOGY - PAP

## 2016-10-29 DIAGNOSIS — N8110 Cystocele, unspecified: Secondary | ICD-10-CM | POA: Diagnosis not present

## 2016-11-03 ENCOUNTER — Encounter (HOSPITAL_BASED_OUTPATIENT_CLINIC_OR_DEPARTMENT_OTHER): Payer: Self-pay | Admitting: *Deleted

## 2016-11-03 NOTE — Progress Notes (Signed)
NPO AFTER MN.  ARRIVE AT 0600.  NEEDS T&S  AND URINE PREG.  GETTING LAB WORK DONE Wednesday , 11-04-2016 (CBC, CMET).  MAY TAKE FIORICET AM DOS W/ SIPS OF WATER IF NEEDED.

## 2016-11-04 ENCOUNTER — Other Ambulatory Visit (HOSPITAL_COMMUNITY): Payer: Self-pay | Admitting: Obstetrics & Gynecology

## 2016-11-04 ENCOUNTER — Other Ambulatory Visit (HOSPITAL_COMMUNITY)
Admit: 2016-11-04 | Discharge: 2016-11-04 | Disposition: A | Discharge status: Home / Self Care | Payer: 59 | Source: Ambulatory Visit | Attending: Obstetrics & Gynecology | Admitting: Obstetrics & Gynecology

## 2016-11-04 DIAGNOSIS — N809 Endometriosis, unspecified: Secondary | ICD-10-CM | POA: Diagnosis not present

## 2016-11-04 DIAGNOSIS — Z01812 Encounter for preprocedural laboratory examination: Secondary | ICD-10-CM | POA: Insufficient documentation

## 2016-11-04 LAB — COMPREHENSIVE METABOLIC PANEL
ALK PHOS: 61 U/L (ref 38–126)
ALT: 14 U/L (ref 14–54)
ANION GAP: 9 (ref 5–15)
AST: 18 U/L (ref 15–41)
Albumin: 3.7 g/dL (ref 3.5–5.0)
BILIRUBIN TOTAL: 0.4 mg/dL (ref 0.3–1.2)
BUN: 8 mg/dL (ref 6–20)
CALCIUM: 8.8 mg/dL — AB (ref 8.9–10.3)
CO2: 24 mmol/L (ref 22–32)
Chloride: 103 mmol/L (ref 101–111)
Creatinine, Ser: 0.73 mg/dL (ref 0.44–1.00)
GFR calc non Af Amer: >60 mL/min (ref >60)
Glucose, Bld: 85 mg/dL (ref 65–99)
POTASSIUM: 3.9 mmol/L (ref 3.5–5.1)
Sodium: 136 mmol/L (ref 135–145)
TOTAL PROTEIN: 7 g/dL (ref 6.5–8.1)

## 2016-11-04 LAB — CBC
HCT: 36.2 % (ref 36.0–46.0)
HEMOGLOBIN: 11.6 g/dL — AB (ref 12.0–15.0)
MCH: 28.1 pg (ref 26.0–34.0)
MCHC: 32 g/dL (ref 30.0–36.0)
MCV: 87.7 fL (ref 78.0–100.0)
Platelets: 342 10*3/uL (ref 150–400)
RBC: 4.13 MIL/uL (ref 3.87–5.11)
RDW: 13.2 % (ref 11.5–15.5)
WBC: 7.5 10*3/uL (ref 4.0–10.5)

## 2016-11-08 NOTE — H&P (Signed)
April Sutton is an 41 y.o. female with known endometriosis desiring definitive surgical management. Her cycles have become increasingly heavier and more painful with time. Additionally, the patient has symptomatic cystocele with bulge sensation. The patient declines hormonal tx as it has failed her in the past.  She has completed child-bearing and her partner has vasectomy.  She was initially diagnosed with endometriosis in 2004 and was treated with YAG laser laparoscopically by Dr. Harle Battiest; disease noted in posterior CDS.    Pertinent Gynecological History: Menses: flow is excessive with use of 8 pads or tampons on heaviest days Bleeding: dysfunctional uterine bleeding Contraception: vasectomy DES exposure: unknown Blood transfusions: none Sexually transmitted diseases: no past history Previous GYN Procedures: laparoscopy  Last mammogram: normal Date: 08/2016 Last pap: normal Date: 07/2016 OB History: G3, P3   Menstrual History: Menarche age: n/a Patient's last menstrual period was 10/23/2016 (approximate).    Past Medical History:  Diagnosis Date  . Dysmenorrhea   . Endometriosis   . Family history of adverse reaction to anesthesia    paternal grandmother--- hard to wake  . Heavy menstrual period   . Migraine   . PONV (postoperative nausea and vomiting)   . Prolapse of female pelvic organs   . Wears contact lenses   . Wears hearing aid    hearing aid right ear only    Past Surgical History:  Procedure Laterality Date  . DX LAPAROSCOPY W/ LASER ABLATION ENDOMETRIOSIS AND UTEROSACRAL NERVE  03-08-2003  dr Gus Height    Family History  Problem Relation Age of Onset  . Heart attack Father   . Heart disease Father   . Diabetes Maternal Grandmother     Social History:  reports that she has never smoked. She has never used smokeless tobacco. She reports that she does not drink alcohol or use drugs.  Allergies:  Allergies  Allergen Reactions  . Imitrex [Sumatriptan]  Other (See Comments)    Adverse reaction--  "made pain 10 times worse"  . Latex Rash    No prescriptions prior to admission.    ROS  Height 5' 2.5" (1.588 m), weight 193 lb (87.5 kg), last menstrual period 10/23/2016. Physical Exam  Constitutional: She is oriented to person, place, and time. She appears well-developed and well-nourished.  Cardiovascular: Normal rate and regular rhythm.   Respiratory: Effort normal and breath sounds normal.  GI: Soft. There is no rebound and no guarding.  Neurological: She is alert and oriented to person, place, and time.  Skin: Skin is warm and dry.    No results found for this or any previous visit (from the past 24 hour(s)).  No results found.  Assessment/Plan: LAVH, BSO, anterior repair Patient has been counseled re: risk of bleeding, infection, scarring and damage to surrounding structures.  She understands the risk of needing to convert to abdominal procedure to complete the case safely as well as the risk of failure of anterior repair.  All questions were answered and the patient wishes to proceed.  Briceyda Abdullah, [place] 11/08/2016, 8:38 PM

## 2016-11-08 NOTE — Anesthesia Preprocedure Evaluation (Signed)
Anesthesia Evaluation  Patient identified by MRN, date of birth, ID band Patient awake    Reviewed: Allergy & Precautions, NPO status , Patient's Chart, lab work & pertinent test results  History of Anesthesia Complications (+) PONV  Airway Mallampati: II  TM Distance: >3 FB Neck ROM: Full    Dental no notable dental hx.    Pulmonary neg pulmonary ROS,    Pulmonary exam normal breath sounds clear to auscultation       Cardiovascular negative cardio ROS Normal cardiovascular exam Rhythm:Regular Rate:Normal     Neuro/Psych negative neurological ROS  negative psych ROS   GI/Hepatic negative GI ROS, Neg liver ROS,   Endo/Other  negative endocrine ROS  Renal/GU negative Renal ROS  negative genitourinary   Musculoskeletal negative musculoskeletal ROS (+)   Abdominal   Peds negative pediatric ROS (+)  Hematology negative hematology ROS (+)   Anesthesia Other Findings   Reproductive/Obstetrics negative OB ROS                             Anesthesia Physical Anesthesia Plan  ASA: I  Anesthesia Plan: General   Post-op Pain Management:    Induction: Intravenous  Airway Management Planned: Oral ETT  Additional Equipment:   Intra-op Plan:   Post-operative Plan: Extubation in OR  Informed Consent: I have reviewed the patients History and Physical, chart, labs and discussed the procedure including the risks, benefits and alternatives for the proposed anesthesia with the patient or authorized representative who has indicated his/her understanding and acceptance.   Dental advisory given  Plan Discussed with: CRNA and Surgeon  Anesthesia Plan Comments:         Anesthesia Quick Evaluation

## 2016-11-09 ENCOUNTER — Encounter (HOSPITAL_BASED_OUTPATIENT_CLINIC_OR_DEPARTMENT_OTHER): Payer: Self-pay | Admitting: *Deleted

## 2016-11-09 ENCOUNTER — Encounter (HOSPITAL_COMMUNITY)
Admission: RE | Disposition: A | Discharge status: Home / Self Care | Payer: Self-pay | Source: Ambulatory Visit | Attending: Obstetrics & Gynecology

## 2016-11-09 ENCOUNTER — Observation Stay (HOSPITAL_BASED_OUTPATIENT_CLINIC_OR_DEPARTMENT_OTHER)
Admission: RE | Admit: 2016-11-09 | Discharge: 2016-11-10 | Disposition: A | Discharge status: Home / Self Care | Payer: 59 | Source: Ambulatory Visit | Attending: Obstetrics & Gynecology | Admitting: Obstetrics & Gynecology

## 2016-11-09 ENCOUNTER — Ambulatory Visit (HOSPITAL_BASED_OUTPATIENT_CLINIC_OR_DEPARTMENT_OTHER): Payer: 59 | Admitting: Anesthesiology

## 2016-11-09 DIAGNOSIS — N946 Dysmenorrhea, unspecified: Secondary | ICD-10-CM | POA: Diagnosis not present

## 2016-11-09 DIAGNOSIS — G43909 Migraine, unspecified, not intractable, without status migrainosus: Secondary | ICD-10-CM | POA: Diagnosis not present

## 2016-11-09 DIAGNOSIS — N8110 Cystocele, unspecified: Secondary | ICD-10-CM | POA: Insufficient documentation

## 2016-11-09 DIAGNOSIS — H9191 Unspecified hearing loss, right ear: Secondary | ICD-10-CM | POA: Diagnosis not present

## 2016-11-09 DIAGNOSIS — N8312 Corpus luteum cyst of left ovary: Secondary | ICD-10-CM | POA: Insufficient documentation

## 2016-11-09 DIAGNOSIS — N809 Endometriosis, unspecified: Secondary | ICD-10-CM | POA: Diagnosis not present

## 2016-11-09 DIAGNOSIS — Z833 Family history of diabetes mellitus: Secondary | ICD-10-CM | POA: Diagnosis not present

## 2016-11-09 DIAGNOSIS — Z8249 Family history of ischemic heart disease and other diseases of the circulatory system: Secondary | ICD-10-CM | POA: Diagnosis not present

## 2016-11-09 DIAGNOSIS — Z888 Allergy status to other drugs, medicaments and biological substances status: Secondary | ICD-10-CM | POA: Insufficient documentation

## 2016-11-09 DIAGNOSIS — Z90710 Acquired absence of both cervix and uterus: Secondary | ICD-10-CM | POA: Diagnosis present

## 2016-11-09 DIAGNOSIS — D252 Subserosal leiomyoma of uterus: Secondary | ICD-10-CM | POA: Diagnosis not present

## 2016-11-09 DIAGNOSIS — N920 Excessive and frequent menstruation with regular cycle: Secondary | ICD-10-CM | POA: Diagnosis not present

## 2016-11-09 DIAGNOSIS — N72 Inflammatory disease of cervix uteri: Secondary | ICD-10-CM | POA: Insufficient documentation

## 2016-11-09 DIAGNOSIS — N8302 Follicular cyst of left ovary: Secondary | ICD-10-CM | POA: Insufficient documentation

## 2016-11-09 DIAGNOSIS — Z91040 Latex allergy status: Secondary | ICD-10-CM | POA: Insufficient documentation

## 2016-11-09 HISTORY — DX: Nausea with vomiting, unspecified: Z98.890

## 2016-11-09 HISTORY — DX: Excessive and frequent menstruation with regular cycle: N92.0

## 2016-11-09 HISTORY — DX: Presence of spectacles and contact lenses: Z97.3

## 2016-11-09 HISTORY — DX: Family history of other specified conditions: Z84.89

## 2016-11-09 HISTORY — DX: Other specified postprocedural states: R11.2

## 2016-11-09 HISTORY — DX: Presence of external hearing-aid: Z97.4

## 2016-11-09 HISTORY — DX: Female genital prolapse, unspecified: N81.9

## 2016-11-09 HISTORY — PX: CYSTOCELE REPAIR: SHX163

## 2016-11-09 HISTORY — PX: LAPAROSCOPIC VAGINAL HYSTERECTOMY WITH SALPINGECTOMY: SHX6680

## 2016-11-09 HISTORY — DX: Migraine, unspecified, not intractable, without status migrainosus: G43.909

## 2016-11-09 HISTORY — DX: Dysmenorrhea, unspecified: N94.6

## 2016-11-09 LAB — POCT PREGNANCY, URINE: Preg Test, Ur: NEGATIVE

## 2016-11-09 LAB — TYPE AND SCREEN
ABO/RH(D): A POS
ANTIBODY SCREEN: NEGATIVE

## 2016-11-09 LAB — ABO/RH: ABO/RH(D): A POS

## 2016-11-09 SURGERY — HYSTERECTOMY, VAGINAL, LAPAROSCOPY-ASSISTED, WITH SALPINGECTOMY
Anesthesia: General | Site: Vagina

## 2016-11-09 MED ORDER — MIDAZOLAM HCL 2 MG/2ML IJ SOLN
INTRAMUSCULAR | Status: AC
  Filled 2016-11-09: qty 2

## 2016-11-09 MED ORDER — SUGAMMADEX SODIUM 200 MG/2ML IV SOLN
INTRAVENOUS | Status: DC | PRN
  Administered 2016-11-09: 200 mg via INTRAVENOUS

## 2016-11-09 MED ORDER — HYDROMORPHONE HCL 2 MG/ML IJ SOLN
INTRAMUSCULAR | Status: AC
  Filled 2016-11-09: qty 1

## 2016-11-09 MED ORDER — FENTANYL CITRATE (PF) 100 MCG/2ML IJ SOLN
INTRAMUSCULAR | Status: AC
  Filled 2016-11-09: qty 2

## 2016-11-09 MED ORDER — DEXAMETHASONE SODIUM PHOSPHATE 4 MG/ML IJ SOLN
INTRAMUSCULAR | Status: DC | PRN
  Administered 2016-11-09: 10 mg via INTRAVENOUS

## 2016-11-09 MED ORDER — KETOROLAC TROMETHAMINE 30 MG/ML IJ SOLN
INTRAMUSCULAR | Status: AC
  Filled 2016-11-09: qty 1

## 2016-11-09 MED ORDER — LIDOCAINE 2% (20 MG/ML) 5 ML SYRINGE
INTRAMUSCULAR | Status: DC | PRN
  Administered 2016-11-09: 80 mg via INTRAVENOUS

## 2016-11-09 MED ORDER — ONDANSETRON HCL 4 MG/2ML IJ SOLN
INTRAMUSCULAR | Status: DC | PRN
  Administered 2016-11-09: 4 mg via INTRAVENOUS

## 2016-11-09 MED ORDER — LIDOCAINE HCL 4 % MT SOLN
OROMUCOSAL | Status: DC | PRN
  Administered 2016-11-09: 2 mL via TOPICAL

## 2016-11-09 MED ORDER — LACTATED RINGERS IV SOLN
INTRAVENOUS | Status: DC
  Filled 2016-11-09: qty 1000

## 2016-11-09 MED ORDER — METOCLOPRAMIDE HCL 5 MG/ML IJ SOLN
INTRAMUSCULAR | Status: DC | PRN
  Administered 2016-11-09: 10 mg via INTRAVENOUS

## 2016-11-09 MED ORDER — HYDROMORPHONE HCL 2 MG/ML IJ SOLN
0.2000 mg | INTRAMUSCULAR | Status: DC | PRN
  Administered 2016-11-09 (×2): 0.5 mg via INTRAVENOUS
  Filled 2016-11-09 (×2): qty 1

## 2016-11-09 MED ORDER — HYDROMORPHONE HCL 1 MG/ML IJ SOLN
0.2500 mg | INTRAMUSCULAR | Status: DC | PRN
  Administered 2016-11-09 (×2): 0.5 mg via INTRAVENOUS
  Filled 2016-11-09: qty 0.5

## 2016-11-09 MED ORDER — PROPOFOL 10 MG/ML IV BOLUS
INTRAVENOUS | Status: AC
  Filled 2016-11-09: qty 40

## 2016-11-09 MED ORDER — BUPIVACAINE HCL (PF) 0.25 % IJ SOLN
INTRAMUSCULAR | Status: AC
  Filled 2016-11-09: qty 30

## 2016-11-09 MED ORDER — SIMETHICONE 80 MG PO CHEW
80.0000 mg | CHEWABLE_TABLET | Freq: Four times a day (QID) | ORAL | Status: DC | PRN
  Administered 2016-11-10: 80 mg via ORAL
  Filled 2016-11-09 (×2): qty 1

## 2016-11-09 MED ORDER — LACTATED RINGERS IV SOLN
INTRAVENOUS | Status: DC
  Administered 2016-11-09 (×2): via INTRAVENOUS
  Filled 2016-11-09: qty 1000

## 2016-11-09 MED ORDER — METOCLOPRAMIDE HCL 5 MG/ML IJ SOLN
INTRAMUSCULAR | Status: AC
  Filled 2016-11-09: qty 2

## 2016-11-09 MED ORDER — FENTANYL CITRATE (PF) 100 MCG/2ML IJ SOLN
INTRAMUSCULAR | Status: DC | PRN
  Administered 2016-11-09 (×4): 50 ug via INTRAVENOUS

## 2016-11-09 MED ORDER — MIDAZOLAM HCL 5 MG/5ML IJ SOLN
INTRAMUSCULAR | Status: DC | PRN
  Administered 2016-11-09: 2 mg via INTRAVENOUS

## 2016-11-09 MED ORDER — PROPOFOL 10 MG/ML IV BOLUS
INTRAVENOUS | Status: DC | PRN
  Administered 2016-11-09: 250 mg via INTRAVENOUS
  Administered 2016-11-09: 20 mg via INTRAVENOUS

## 2016-11-09 MED ORDER — ONDANSETRON HCL 4 MG/2ML IJ SOLN: 4.0000 mg | Freq: Four times a day (QID) | INTRAMUSCULAR | Status: DC | PRN

## 2016-11-09 MED ORDER — SCOPOLAMINE 1 MG/3DAYS TD PT72
MEDICATED_PATCH | TRANSDERMAL | Status: DC | PRN
  Administered 2016-11-09: 1 via TRANSDERMAL

## 2016-11-09 MED ORDER — KETOROLAC TROMETHAMINE 30 MG/ML IJ SOLN: 30.0000 mg | Freq: Four times a day (QID) | INTRAMUSCULAR | Status: DC

## 2016-11-09 MED ORDER — ROCURONIUM BROMIDE 50 MG/5ML IV SOSY
PREFILLED_SYRINGE | INTRAVENOUS | Status: DC | PRN
  Administered 2016-11-09: 10 mg via INTRAVENOUS
  Administered 2016-11-09: 50 mg via INTRAVENOUS
  Administered 2016-11-09: 10 mg via INTRAVENOUS

## 2016-11-09 MED ORDER — OXYCODONE-ACETAMINOPHEN 5-325 MG PO TABS
1.0000 | ORAL_TABLET | ORAL | Status: DC | PRN
  Administered 2016-11-09: 2 via ORAL
  Administered 2016-11-09 (×2): 1 via ORAL
  Administered 2016-11-09: 2 via ORAL
  Administered 2016-11-10: 1 via ORAL
  Filled 2016-11-09 (×2): qty 2
  Filled 2016-11-09 (×3): qty 1

## 2016-11-09 MED ORDER — LACTATED RINGERS IV SOLN
INTRAVENOUS | Status: DC
  Administered 2016-11-09 – 2016-11-10 (×3): via INTRAVENOUS

## 2016-11-09 MED ORDER — ARTIFICIAL TEARS OP OINT
TOPICAL_OINTMENT | OPHTHALMIC | Status: AC
  Filled 2016-11-09: qty 3.5

## 2016-11-09 MED ORDER — DOCUSATE SODIUM 100 MG PO CAPS
100.0000 mg | ORAL_CAPSULE | Freq: Two times a day (BID) | ORAL | Status: DC
  Administered 2016-11-09 – 2016-11-10 (×2): 100 mg via ORAL
  Filled 2016-11-09 (×2): qty 1

## 2016-11-09 MED ORDER — DIPHENHYDRAMINE HCL 25 MG PO CAPS
25.0000 mg | ORAL_CAPSULE | Freq: Four times a day (QID) | ORAL | Status: DC | PRN
  Administered 2016-11-09 (×2): 25 mg via ORAL
  Filled 2016-11-09 (×3): qty 1

## 2016-11-09 MED ORDER — LIDOCAINE-EPINEPHRINE (PF) 1 %-1:200000 IJ SOLN
INTRAMUSCULAR | Status: AC
  Filled 2016-11-09: qty 30

## 2016-11-09 MED ORDER — BUPIVACAINE HCL (PF) 0.25 % IJ SOLN
INTRAMUSCULAR | Status: DC | PRN
Start: 2016-11-09 — End: 2016-11-09
  Administered 2016-11-09: 3 mL

## 2016-11-09 MED ORDER — ESTRADIOL 0.1 MG/GM VA CREA
TOPICAL_CREAM | VAGINAL | Status: DC | PRN
  Administered 2016-11-09: 1 via VAGINAL

## 2016-11-09 MED ORDER — PROMETHAZINE HCL 25 MG/ML IJ SOLN
6.2500 mg | INTRAMUSCULAR | Status: DC | PRN
  Filled 2016-11-09: qty 1

## 2016-11-09 MED ORDER — ONDANSETRON HCL 4 MG PO TABS: 4.0000 mg | ORAL_TABLET | Freq: Four times a day (QID) | ORAL | Status: DC | PRN

## 2016-11-09 MED ORDER — ESTRADIOL 0.1 MG/GM VA CREA
TOPICAL_CREAM | VAGINAL | Status: AC
  Filled 2016-11-09: qty 42.5

## 2016-11-09 MED ORDER — LACTATED RINGERS IR SOLN
Status: DC | PRN
  Administered 2016-11-09: 300 mL

## 2016-11-09 MED ORDER — KETOROLAC TROMETHAMINE 30 MG/ML IJ SOLN
30.0000 mg | Freq: Once | INTRAMUSCULAR | Status: DC | PRN
  Filled 2016-11-09: qty 1

## 2016-11-09 MED ORDER — SUGAMMADEX SODIUM 200 MG/2ML IV SOLN
INTRAVENOUS | Status: AC
  Filled 2016-11-09: qty 2

## 2016-11-09 MED ORDER — SUCCINYLCHOLINE CHLORIDE 200 MG/10ML IV SOSY
PREFILLED_SYRINGE | INTRAVENOUS | Status: AC
  Filled 2016-11-09: qty 10

## 2016-11-09 MED ORDER — CEFAZOLIN SODIUM-DEXTROSE 2-4 GM/100ML-% IV SOLN
2.0000 g | INTRAVENOUS | Status: AC
  Administered 2016-11-09: 2 g via INTRAVENOUS
  Filled 2016-11-09: qty 100

## 2016-11-09 MED ORDER — ROCURONIUM BROMIDE 50 MG/5ML IV SOSY
PREFILLED_SYRINGE | INTRAVENOUS | Status: AC
  Filled 2016-11-09: qty 5

## 2016-11-09 MED ORDER — DEXAMETHASONE SODIUM PHOSPHATE 10 MG/ML IJ SOLN
INTRAMUSCULAR | Status: AC
  Filled 2016-11-09: qty 1

## 2016-11-09 MED ORDER — SCOPOLAMINE 1 MG/3DAYS TD PT72
MEDICATED_PATCH | TRANSDERMAL | Status: AC
  Filled 2016-11-09: qty 1

## 2016-11-09 MED ORDER — KETOROLAC TROMETHAMINE 30 MG/ML IJ SOLN
30.0000 mg | Freq: Four times a day (QID) | INTRAMUSCULAR | Status: DC
  Administered 2016-11-09 – 2016-11-10 (×4): 30 mg via INTRAVENOUS
  Filled 2016-11-09 (×4): qty 1

## 2016-11-09 MED ORDER — MENTHOL 3 MG MT LOZG: 1.0000 | LOZENGE | OROMUCOSAL | Status: DC | PRN

## 2016-11-09 MED ORDER — LIDOCAINE 2% (20 MG/ML) 5 ML SYRINGE
INTRAMUSCULAR | Status: AC
  Filled 2016-11-09: qty 10

## 2016-11-09 MED ORDER — CEFAZOLIN SODIUM-DEXTROSE 2-4 GM/100ML-% IV SOLN
INTRAVENOUS | Status: AC
  Filled 2016-11-09: qty 100

## 2016-11-09 MED ORDER — ONDANSETRON HCL 4 MG/2ML IJ SOLN
INTRAMUSCULAR | Status: AC
  Filled 2016-11-09: qty 2

## 2016-11-09 SURGICAL SUPPLY — 95 items
APPLICATOR COTTON TIP 6IN STRL (MISCELLANEOUS) IMPLANT
BLADE CLIPPER SURG (BLADE) ×4 IMPLANT
BLADE SURG 11 STRL SS (BLADE) ×4 IMPLANT
BLADE SURG 15 STRL LF DISP TIS (BLADE) ×2 IMPLANT
BLADE SURG 15 STRL SS (BLADE) ×2
BNDG GAUZE ELAST 4 BULKY (GAUZE/BANDAGES/DRESSINGS) IMPLANT
BRIEF STRETCH FOR OB PAD LRG (UNDERPADS AND DIAPERS) IMPLANT
CANISTER SUCTION 2500CC (MISCELLANEOUS) ×4 IMPLANT
CATH ROBINSON RED A/P 16FR (CATHETERS) IMPLANT
CHLORAPREP W/TINT 26ML (MISCELLANEOUS) ×4 IMPLANT
COVER BACK TABLE 60X90IN (DRAPES) ×8 IMPLANT
COVER LIGHT HANDLE  1/PK (MISCELLANEOUS) ×2
COVER LIGHT HANDLE 1/PK (MISCELLANEOUS) ×2 IMPLANT
COVER MAYO STAND STRL (DRAPES) ×8 IMPLANT
DECANTER SPIKE VIAL GLASS SM (MISCELLANEOUS) IMPLANT
DERMABOND ADVANCED (GAUZE/BANDAGES/DRESSINGS) ×2
DERMABOND ADVANCED .7 DNX12 (GAUZE/BANDAGES/DRESSINGS) ×2 IMPLANT
DEVICE CAPIO SLIM SINGLE (INSTRUMENTS) IMPLANT
DRAPE LG THREE QUARTER DISP (DRAPES) ×4 IMPLANT
DRAPE UNDERBUTTOCKS STRL (DRAPE) ×4 IMPLANT
DRSG COVADERM PLUS 2X2 (GAUZE/BANDAGES/DRESSINGS) ×4 IMPLANT
DURAPREP 26ML APPLICATOR (WOUND CARE) IMPLANT
ELECT LIGASURE LONG (ELECTRODE) ×4 IMPLANT
ELECT REM PT RETURN 9FT ADLT (ELECTROSURGICAL) ×4
ELECTRODE REM PT RTRN 9FT ADLT (ELECTROSURGICAL) ×2 IMPLANT
FILTER SMOKE EVAC LAPAROSHD (FILTER) IMPLANT
GAUZE SPONGE 4X4 16PLY XRAY LF (GAUZE/BANDAGES/DRESSINGS) ×4 IMPLANT
GLOVE BIO SURGEON STRL SZ 6 (GLOVE) IMPLANT
GLOVE BIOGEL PI IND STRL 6 (GLOVE) ×4 IMPLANT
GLOVE BIOGEL PI INDICATOR 6 (GLOVE) ×4
GLOVE ECLIPSE 6.5 STRL STRAW (GLOVE) IMPLANT
GLOVE INDICATOR 7.0 STRL GRN (GLOVE) ×16 IMPLANT
GLOVE SURG SS PI 6.0 STRL IVOR (GLOVE) ×8 IMPLANT
GLOVE SURG SS PI 6.5 STRL IVOR (GLOVE) ×8 IMPLANT
GLOVE SURG SS PI 7.0 STRL IVOR (GLOVE) ×16 IMPLANT
GOWN STRL REUS W/ TWL LRG LVL3 (GOWN DISPOSABLE) ×12 IMPLANT
GOWN STRL REUS W/TWL LRG LVL3 (GOWN DISPOSABLE) ×12 IMPLANT
HOLDER FOLEY CATH W/STRAP (MISCELLANEOUS) ×4 IMPLANT
IV NS IRRIG 3000ML ARTHROMATIC (IV SOLUTION) IMPLANT
KIT ROOM TURNOVER WOR (KITS) ×4 IMPLANT
LEGGING LITHOTOMY PAIR STRL (DRAPES) ×4 IMPLANT
LIGASURE LAP L-HOOKWIRE 5 44CM (INSTRUMENTS) ×4 IMPLANT
MANIFOLD NEPTUNE II (INSTRUMENTS) IMPLANT
MANIPULATOR UTERINE 4.5 ZUMI (MISCELLANEOUS) IMPLANT
NEEDLE HYPO 22GX1.5 SAFETY (NEEDLE) IMPLANT
NEEDLE HYPO 25X1 1.5 SAFETY (NEEDLE) ×4 IMPLANT
NEEDLE INSUFFLATION 14GA 120MM (NEEDLE) ×4 IMPLANT
NEEDLE INSUFFLATION 14GA 150MM (NEEDLE) IMPLANT
NEEDLE MAYO .5 CIRCLE (NEEDLE) IMPLANT
NEEDLE MAYO 6 CRC TAPER PT (NEEDLE) IMPLANT
NEEDLE SPNL 22GX3.5 QUINCKE BK (NEEDLE) IMPLANT
NS IRRIG 1000ML POUR BTL (IV SOLUTION) IMPLANT
NS IRRIG 500ML POUR BTL (IV SOLUTION) ×4 IMPLANT
PACK BASIN DAY SURGERY FS (CUSTOM PROCEDURE TRAY) ×4 IMPLANT
PACK VAGINAL WOMENS (CUSTOM PROCEDURE TRAY) IMPLANT
PACKING VAGINAL (PACKING) ×4 IMPLANT
PAD OB MATERNITY 4.3X12.25 (PERSONAL CARE ITEMS) ×4 IMPLANT
PAD PREP 24X48 CUFFED NSTRL (MISCELLANEOUS) ×4 IMPLANT
PADDING ION DISPOSABLE (MISCELLANEOUS) ×4 IMPLANT
PENCIL BUTTON HOLSTER BLD 10FT (ELECTRODE) ×4 IMPLANT
POUCH SPECIMEN RETRIEVAL 10MM (ENDOMECHANICALS) IMPLANT
SCISSORS LAP 5X35 DISP (ENDOMECHANICALS) IMPLANT
SEALER TISSUE G2 CVD JAW 45CM (ENDOMECHANICALS) IMPLANT
SET IRRIG TUBING LAPAROSCOPIC (IRRIGATION / IRRIGATOR) ×4 IMPLANT
SET IRRIG Y TYPE TUR BLADDER L (SET/KITS/TRAYS/PACK) IMPLANT
SHEET LAVH (DRAPES) ×4 IMPLANT
SOL PREP PROV IODINE SCRUB 4OZ (MISCELLANEOUS) ×4 IMPLANT
SOLUTION ANTI FOG 6CC (MISCELLANEOUS) ×4 IMPLANT
SPONGE LAP 4X18 X RAY DECT (DISPOSABLE) ×4 IMPLANT
SUT CAPIO POLYGLYCOLIC (SUTURE) IMPLANT
SUT MNCRL 0 MO-4 VIOLET 18 CR (SUTURE) ×6 IMPLANT
SUT MNCRL AB 3-0 PS2 18 (SUTURE) ×4 IMPLANT
SUT MON AB 2-0 CT1 36 (SUTURE) ×4 IMPLANT
SUT MON AB 3-0 SH 27 (SUTURE)
SUT MON AB 3-0 SH27 (SUTURE) IMPLANT
SUT MONOCRYL 0 MO 4 18  CR/8 (SUTURE) ×6
SUT VIC AB 2-0 CT1 27 (SUTURE)
SUT VIC AB 2-0 CT1 TAPERPNT 27 (SUTURE) IMPLANT
SUT VIC AB 2-0 CT2 27 (SUTURE) ×12 IMPLANT
SUT VICRYL 0 TIES 12 18 (SUTURE) ×4 IMPLANT
SUT VICRYL 0 UR6 27IN ABS (SUTURE) ×4 IMPLANT
SYR BULB IRRIGATION 50ML (SYRINGE) ×4 IMPLANT
SYR CONTROL 10ML LL (SYRINGE) ×8 IMPLANT
SYRINGE 10CC LL (SYRINGE) ×4 IMPLANT
TOWEL OR 17X24 6PK STRL BLUE (TOWEL DISPOSABLE) ×12 IMPLANT
TRAY DSU PREP LF (CUSTOM PROCEDURE TRAY) ×4 IMPLANT
TRAY FOLEY CATH SILVER 14FR (SET/KITS/TRAYS/PACK) ×8 IMPLANT
TROCAR BLADELESS OPT 5 100 (ENDOMECHANICALS) ×4 IMPLANT
TROCAR XCEL NON-BLD 11X100MML (ENDOMECHANICALS) ×4 IMPLANT
TUBE CONNECTING 12'X1/4 (SUCTIONS) ×2
TUBE CONNECTING 12X1/4 (SUCTIONS) ×6 IMPLANT
TUBING INSUFFLATION 10FT LAP (TUBING) ×4 IMPLANT
WARMER LAPAROSCOPE (MISCELLANEOUS) ×4 IMPLANT
WATER STERILE IRR 500ML POUR (IV SOLUTION) ×4 IMPLANT
YANKAUER SUCT BULB TIP NO VENT (SUCTIONS) ×4 IMPLANT

## 2016-11-09 NOTE — Anesthesia Procedure Notes (Signed)
Procedure Name: Intubation Date/Time: 11/09/2016 7:32 AM Performed by: Myrtie Soman Pre-anesthesia Checklist: Patient identified, Emergency Drugs available, Suction available and Patient being monitored Patient Re-evaluated:Patient Re-evaluated prior to inductionOxygen Delivery Method: Circle system utilized Preoxygenation: Pre-oxygenation with 100% oxygen Intubation Type: IV induction Ventilation: Mask ventilation without difficulty Laryngoscope Size: Mac and 3 Grade View: Grade I Tube type: Oral Tube size: 7.5 mm Number of attempts: 1 Airway Equipment and Method: Stylet and LTA kit utilized Placement Confirmation: ETT inserted through vocal cords under direct vision,  positive ETCO2 and breath sounds checked- equal and bilateral Secured at: 21 cm Tube secured with: Tape Dental Injury: Teeth and Oropharynx as per pre-operative assessment

## 2016-11-09 NOTE — Op Note (Signed)
PROCEDURE DATE: 11/09/16 PREOPERATIVE DIAGNOSIS: Endometriosis, dysmenorrhea, heavy menstrual bleeding, symptomatic cystocele POSTOPERATIVE DIAGNOSIS: The same  PROCEDURE: Laparoscopic Assisted Vaginal Hysterectomy, Bilateral Salpingo-oophorectomy, Anterior Colporrhaphy SURGEON: Dr. Linda Hedges  ASSISTANT: Dr. Molli Posey INDICATIONS: 41 y.o. G3P3 with endometriosis, heavy menstrual bleeding, and dysmenorrhea desiring definitive surgical management. Risks of surgery were discussed with the patient including but not limited to: bleeding which may require transfusion or reoperation; infection which may require antibiotics; injury to bowel, bladder, ureters or other surrounding organs; need for additional procedures including laparotomy; thromboembolic phenomenon, incisional problems and other postoperative/anesthesia complications. Written informed consent was obtained.  FINDINGS: Minimally enlarged uterus, normal adnexa bilaterally. No evidence of endometriosis. Normal upper abdomen. Normal appearing appendix. ANESTHESIA: General  ESTIMATED BLOOD LOSS: 350 ml  SPECIMENS: Uterus and cervix, bilateral fallopian tubes and ovaries COMPLICATIONS: None immediate  PROCEDURE IN DETAIL: The patient received intravenous antibiotics and had sequential compression devices applied to her lower extremities while in the preoperative area. She was then taken to the operating room where general anesthesia was administered and was found to be adequate. She was placed in the dorsal lithotomy position, and was prepped and draped in a sterile manner. An in and out catheterization was performed. A uterine manipulator was then advanced into the uterus . After an adequate timeout was performed, attention was then turned to the patient's abdomen where a 10-mm skin incision was made in the umbilical fold. The Veress needle was carefully introduced into the peritoneal cavity through the abdominal wall. Intraperitoneal placement  was confirmed by drop in intraabdominal pressure with insufflation of carbon dioxide gas. Adequate pneumoperitoneum was obtained, and the 54mm  trocar and sleeve were then advanced without difficulty into the abdomen where intraabdominal placement was confirmed by the laparoscope. A survey of the patient's pelvis and abdomen revealed entirely normal anatomy. Suprapubic 5 mm port was then placed under direct visualization. The pelvis was then carefully examined. On the right side, the fallopian tube was elevated and using the LigaSure, freed from the mesosalpinx. The IP was also clamped, coagulated and transected. Next, the round ligament was clamped, coagulated and cut.  The leaves of the broad ligament were separated and serially transected. These procedures were then repeated on the left side.  The ureters were noted to be safely away from the area of dissection.  At this point, attention was turned to the vaginal portion of the case. A weighted speculum was placed posteriorly, a Deaver anteriorly, and the cervix grasped with a thyroid tenaculum. Once the anterior and posterior reflections were identified, the cervix was circumscribed using the Bovie knife. Next, using Mayos, the posterior cul-de-sac was entered. The LigaSure was then used to grasp the uterosacrals which were coapted and cut. Next, the bladder reflection was identified. Using Metzenbaums, it was entered and palpation and direct visualization confirmed proper location. Next, using the LigaSure, the uterine arteries were coapted and cut bilaterally. The pedicles were visualized after coaptation and were hemostatic. The same was performed sequentially cephalad until the uterus and cervix were removed. The pedicles were inspected and found to be hemostatic.  Next, the uterosacrals were tagged with 0 monocryl bilaterally. The uterosacrals were brought together in the midline cuff closure with a figure-of-8 stitch using 0 monocryl followed by the  remainder of the cuff closure in the same fashion. The cuff was inspected and found to be hemostatic. Foley catheter was placed and clear yellow urine noted.   Attention was next turned to the anterior colporrhaphy.  The distal  and proximal limits of the cystocele was located and Allis clamps were placed at the vaginal mucosal limits.  Using a 15 blade, the skin was incised between the Allis clamps.  Allis clamps were placed bilaterally to retract vaginal skin.  The skin was dissected off the bladder using sharp and blunt dissection.  Using 0 monocryl suture, the break in the pubocervical fascia was repaired reducing the cystocele.  Venous oozing was rendered hemostatic using Bovie cautery.  Excess vaginal skin was removed.  The vaginal skin was reapproximated using 0 monocryl in a running locked fashion.  Hemostasis was noted.  Estrace coated vaginal packing was placed.  Attention was returned to the abdomen were a second laparoscopic look was taken. All pedicles were hemostatic. Insufflation was removed after all instruments were removed.  Infraumbilical fascial incision was closed using 0 vicryl in a figure of eight stitch.  All skin incisions were closed with 4-0 Vicryl subcuticular stitches and Dermabond. The patient tolerated the procedures well. All instruments, needles, and sponge counts were correct x 2. The patient was taken to the recovery room awake, extubated and in stable condition.

## 2016-11-09 NOTE — Progress Notes (Signed)
No change to H&P.  Planning LAVH, BSO, anterior repair.  Linda Hedges, DO

## 2016-11-09 NOTE — Progress Notes (Signed)
NOS  Poor pain control currently (po Percocet).  No n/v.  Tolerating clears.  Ambulating to hallway.  No flatus.  No CP/SOB.  VSS. AF. UOP clear and adequate  Gen: A&O x 3 Abd: soft, ND, dressings c/d x 2 Ext: no c/c/e  POD#0 s/p LAVH, BSO, anterior colporrhaphy -K pad -IV Dilaudid -Advance diet -D/C foley and IVF in AM -AM labs pending.  Linda Hedges, DO

## 2016-11-09 NOTE — Transfer of Care (Signed)
Last Vitals:  Vitals:   11/09/16 0609 11/09/16 0937  BP: 116/64   Pulse: 80   Resp: 16   Temp: 36.8 C (P) 36.6 C    Last Pain:  Vitals:   11/09/16 0609  TempSrc: Oral      Patients Stated Pain Goal: 7 (11/09/16 0636)  Immediate Anesthesia Transfer of Care Note  Patient: April Sutton  Procedure(s) Performed: Procedure(s) (LRB): LAPAROSCOPIC ASSISTED VAGINAL HYSTERECTOMY WITH BILATERAL SALPINGO-OOPHERECTOMY, Anterior Rpr. (Bilateral) ANTERIOR REPAIR (CYSTOCELE) (N/A)  Patient Location: PACU  Anesthesia Type: General  Level of Consciousness: awake, alert  and oriented  Airway & Oxygen Therapy: Patient Spontanous Breathing and Patient connected to nasal cannula oxygen  Post-op Assessment: Report given to PACU RN and Post -op Vital signs reviewed and stable  Post vital signs: Reviewed and stable  Complications: No apparent anesthesia complications

## 2016-11-10 ENCOUNTER — Encounter (HOSPITAL_BASED_OUTPATIENT_CLINIC_OR_DEPARTMENT_OTHER): Payer: Self-pay | Admitting: Obstetrics & Gynecology

## 2016-11-10 DIAGNOSIS — D252 Subserosal leiomyoma of uterus: Secondary | ICD-10-CM | POA: Diagnosis not present

## 2016-11-10 LAB — COMPREHENSIVE METABOLIC PANEL
ALBUMIN: 2.7 g/dL — AB (ref 3.5–5.0)
ALK PHOS: 41 U/L (ref 38–126)
ALT: 12 U/L — ABNORMAL LOW (ref 14–54)
ANION GAP: 6 (ref 5–15)
AST: 14 U/L — ABNORMAL LOW (ref 15–41)
BILIRUBIN TOTAL: 0.2 mg/dL — AB (ref 0.3–1.2)
BUN: 6 mg/dL (ref 6–20)
CALCIUM: 8.5 mg/dL — AB (ref 8.9–10.3)
CO2: 24 mmol/L (ref 22–32)
Chloride: 106 mmol/L (ref 101–111)
Creatinine, Ser: 0.7 mg/dL (ref 0.44–1.00)
GLUCOSE: 112 mg/dL — AB (ref 65–99)
POTASSIUM: 3.9 mmol/L (ref 3.5–5.1)
Sodium: 136 mmol/L (ref 135–145)
TOTAL PROTEIN: 5.7 g/dL — AB (ref 6.5–8.1)

## 2016-11-10 LAB — CBC
HEMATOCRIT: 27.7 % — AB (ref 36.0–46.0)
Hemoglobin: 9 g/dL — ABNORMAL LOW (ref 12.0–15.0)
MCH: 28.7 pg (ref 26.0–34.0)
MCHC: 32.5 g/dL (ref 30.0–36.0)
MCV: 88.2 fL (ref 78.0–100.0)
Platelets: 262 10*3/uL (ref 150–400)
RBC: 3.14 MIL/uL — ABNORMAL LOW (ref 3.87–5.11)
RDW: 13.5 % (ref 11.5–15.5)
WBC: 11.9 10*3/uL — ABNORMAL HIGH (ref 4.0–10.5)

## 2016-11-10 MED ORDER — IBUPROFEN 800 MG PO TABS: 800.0000 mg | ORAL_TABLET | Freq: Four times a day (QID) | ORAL | 0 refills | Status: DC | PRN

## 2016-11-10 MED ORDER — OXYCODONE-ACETAMINOPHEN 5-325 MG PO TABS: 1.0000 | ORAL_TABLET | ORAL | 0 refills | Status: DC | PRN

## 2016-11-10 NOTE — Discharge Instructions (Signed)
Call MD for T>100.4, heavy vaginal bleeding, severe abdominal pain, intractable nausea and/or vomiting or respiratory distress.  Call office to schedule postop appointment in 2 weeks.  No driving while taking narcotics.  Pelvic rest x 6 weeks.

## 2016-11-10 NOTE — Discharge Summary (Signed)
Physician Discharge Summary  Patient ID: April Sutton MRN: [id number] DOB/AGE: 16-Jan-1976 41 y.o.  Admit date: 11/09/2016 Discharge date: 11/10/2016  Admission Diagnoses:  Endometriosis, dysmenorrhea, heavy menstrual bleeding, cystocele  Discharge Diagnoses:  SAA Active Problems:   S/P hysterectomy   Discharged Condition: good  Hospital Course: The patient was admitted for anticipated LAVH, BSO, anterior colporrhaphy.  The procedure was completed without complication.  On POD#1, the patient was doing well, meeting all postop goals.  She was discharged home with office f/u in 2 weeks.  Consults: None  Significant Diagnostic Studies: labs: Hgb 9  Treatments: surgery: LAVH, BSO, anterior colporrhaphy  Discharge Exam: Blood pressure 126/66, pulse 66, temperature 98.1 F (36.7 C), temperature source Oral, resp. rate 18, height 5' 2.5" (1.588 m), weight 197 lb (89.4 kg), last menstrual period 10/23/2016, SpO2 98 %. General appearance: alert, cooperative and appears stated age GI: abnormal findings:  non-distended, approp ttp Pelvic: vaginal packing removed Extremities: extremities normal, atraumatic, no cyanosis or edema Incision/Wound: c/d/i x 2  Disposition: 01-Home or Self Care  Discharge Instructions    Discharge    Complete by:  As directed      Allergies as of 11/10/2016      Reactions   Imitrex [sumatriptan] Other (See Comments)   Adverse reaction--  "made pain 10 times worse"   Latex Rash      Medication List    TAKE these medications   B-12 1000 MCG Caps Take 1,000 mcg by mouth daily.   butalbital-acetaminophen-caffeine 50-325-40 MG tablet Commonly known as:  FIORICET, ESGIC Take 1 tablet by mouth 2 (two) times daily as needed for headache or migraine.   EXCEDRIN MIGRAINE 250-250-65 MG tablet Generic drug:  aspirin-acetaminophen-caffeine Take 2 tablets by mouth every 6 (six) hours as needed for headache or migraine.   ibuprofen 800 MG tablet Commonly  known as:  ADVIL Take 1 tablet (800 mg total) by mouth every 6 (six) hours as needed.   oxyCODONE-acetaminophen 5-325 MG tablet Commonly known as:  PERCOCET/ROXICET Take 1-2 tablets by mouth every 4 (four) hours as needed (moderate to severe pain (when tolerating fluids)).   PROBIOTIC DAILY Caps Take 1 capsule by mouth daily.   vitamin C 1000 MG tablet Take 1,000 mg by mouth daily.        SignedLinda Hedges 11/10/2016, 10:04 AM

## 2016-11-10 NOTE — Progress Notes (Addendum)
1 Day Post-Op Procedure(s) (LRB): LAPAROSCOPIC ASSISTED VAGINAL HYSTERECTOMY WITH BILATERAL SALPINGO-OOPHERECTOMY, Anterior Rpr. (Bilateral) ANTERIOR REPAIR (CYSTOCELE) (N/A)  Subjective: Patient reports tolerating PO, + flatus and no problems voiding.    Objective: I have reviewed patient's vital signs, intake and output, medications and labs.  General: alert, cooperative and appears stated age GI: incision: clean, dry and intact and approp ttp, non-distended Extremities: extremities normal, atraumatic, no cyanosis or edema  Pelvic: vaginal packing removed  Assessment: s/p Procedure(s): LAPAROSCOPIC ASSISTED VAGINAL HYSTERECTOMY WITH BILATERAL SALPINGO-OOPHERECTOMY, Anterior Rpr. (Bilateral) ANTERIOR REPAIR (CYSTOCELE) (N/A): stable, progressing well and tolerating diet  Plan: Discharge home  LOS: 0 days    April Sutton 11/10/2016, 10:00 AM

## 2016-11-10 NOTE — Anesthesia Postprocedure Evaluation (Addendum)
Anesthesia Post Note  Patient: April Sutton  Procedure(s) Performed: Procedure(s) (LRB): LAPAROSCOPIC ASSISTED VAGINAL HYSTERECTOMY WITH BILATERAL SALPINGO-OOPHERECTOMY, Anterior Rpr. (Bilateral) ANTERIOR REPAIR (CYSTOCELE) (N/A)  Patient location during evaluation: PACU Anesthesia Type: General Level of consciousness: awake and alert Pain management: pain level controlled Vital Signs Assessment: post-procedure vital signs reviewed and stable Respiratory status: spontaneous breathing, nonlabored ventilation, respiratory function stable and patient connected to nasal cannula oxygen Cardiovascular status: blood pressure returned to baseline and stable Postop Assessment: no signs of nausea or vomiting Anesthetic complications: no       Last Vitals:  Vitals:   11/10/16 0130 11/10/16 0540  BP: 122/67 126/66  Pulse: 92 66  Resp: 16 18  Temp: 37 C 36.7 C    Last Pain:  Vitals:   11/10/16 0641  TempSrc:   PainSc: 4                  [name] S

## 2017-03-10 DIAGNOSIS — N951 Menopausal and female climacteric states: Secondary | ICD-10-CM | POA: Diagnosis not present

## 2017-03-15 NOTE — Addendum Note (Signed)
Addendum  created 03/15/17 1032 by Tnia Anglada, MD   Sign clinical note    

## 2017-06-03 DIAGNOSIS — R102 Pelvic and perineal pain: Secondary | ICD-10-CM | POA: Diagnosis not present

## 2017-06-03 DIAGNOSIS — N760 Acute vaginitis: Secondary | ICD-10-CM | POA: Diagnosis not present

## 2017-07-01 DIAGNOSIS — R1031 Right lower quadrant pain: Secondary | ICD-10-CM | POA: Insufficient documentation

## 2017-07-01 DIAGNOSIS — Z91040 Latex allergy status: Secondary | ICD-10-CM | POA: Insufficient documentation

## 2017-07-01 DIAGNOSIS — Z79899 Other long term (current) drug therapy: Secondary | ICD-10-CM | POA: Insufficient documentation

## 2017-07-01 DIAGNOSIS — R1011 Right upper quadrant pain: Secondary | ICD-10-CM | POA: Diagnosis not present

## 2017-07-02 ENCOUNTER — Encounter (HOSPITAL_BASED_OUTPATIENT_CLINIC_OR_DEPARTMENT_OTHER): Payer: Self-pay

## 2017-07-02 ENCOUNTER — Other Ambulatory Visit (HOSPITAL_BASED_OUTPATIENT_CLINIC_OR_DEPARTMENT_OTHER): Payer: Self-pay | Admitting: Radiology

## 2017-07-02 ENCOUNTER — Emergency Department (HOSPITAL_BASED_OUTPATIENT_CLINIC_OR_DEPARTMENT_OTHER): Payer: 59

## 2017-07-02 ENCOUNTER — Emergency Department (HOSPITAL_BASED_OUTPATIENT_CLINIC_OR_DEPARTMENT_OTHER)
Admission: EM | Admit: 2017-07-02 | Discharge: 2017-07-02 | Disposition: A | Discharge status: Home / Self Care | Payer: 59 | Attending: Emergency Medicine | Admitting: Emergency Medicine

## 2017-07-02 DIAGNOSIS — R1011 Right upper quadrant pain: Secondary | ICD-10-CM | POA: Diagnosis not present

## 2017-07-02 DIAGNOSIS — R1031 Right lower quadrant pain: Secondary | ICD-10-CM

## 2017-07-02 LAB — COMPREHENSIVE METABOLIC PANEL
ALBUMIN: 3.9 g/dL (ref 3.5–5.0)
ALT: 23 U/L (ref 14–54)
ANION GAP: 9 (ref 5–15)
AST: 25 U/L (ref 15–41)
Alkaline Phosphatase: 86 U/L (ref 38–126)
BUN: 11 mg/dL (ref 6–20)
CHLORIDE: 101 mmol/L (ref 101–111)
CO2: 23 mmol/L (ref 22–32)
Calcium: 9.5 mg/dL (ref 8.9–10.3)
Creatinine, Ser: 0.71 mg/dL (ref 0.44–1.00)
GFR calc Af Amer: >60 mL/min (ref >60)
GFR calc non Af Amer: >60 mL/min (ref >60)
GLUCOSE: 121 mg/dL — AB (ref 65–99)
POTASSIUM: 3.5 mmol/L (ref 3.5–5.1)
SODIUM: 133 mmol/L — AB (ref 135–145)
Total Bilirubin: 0.6 mg/dL (ref 0.3–1.2)
Total Protein: 8.3 g/dL — ABNORMAL HIGH (ref 6.5–8.1)

## 2017-07-02 LAB — URINALYSIS, ROUTINE W REFLEX MICROSCOPIC
BILIRUBIN URINE: NEGATIVE
GLUCOSE, UA: NEGATIVE mg/dL
KETONES UR: NEGATIVE mg/dL
Nitrite: NEGATIVE
PROTEIN: NEGATIVE mg/dL
Specific Gravity, Urine: <1.005 — ABNORMAL LOW (ref 1.005–1.030)
pH: 6 (ref 5.0–8.0)

## 2017-07-02 LAB — URINALYSIS, MICROSCOPIC (REFLEX)

## 2017-07-02 LAB — CBC WITH DIFFERENTIAL/PLATELET
Basophils Absolute: 0 10*3/uL (ref 0.0–0.1)
Basophils Relative: 0 %
EOS ABS: 0 10*3/uL (ref 0.0–0.7)
EOS PCT: 0 %
HEMATOCRIT: 38.9 % (ref 36.0–46.0)
Hemoglobin: 13.2 g/dL (ref 12.0–15.0)
Lymphocytes Relative: 6 %
Lymphs Abs: 1.2 10*3/uL (ref 0.7–4.0)
MCH: 30.1 pg (ref 26.0–34.0)
MCHC: 33.9 g/dL (ref 30.0–36.0)
MCV: 88.6 fL (ref 78.0–100.0)
MONO ABS: 0.8 10*3/uL (ref 0.1–1.0)
MONOS PCT: 4 %
Neutro Abs: 17.7 10*3/uL — ABNORMAL HIGH (ref 1.7–7.7)
Neutrophils Relative %: 90 %
PLATELETS: 300 10*3/uL (ref 150–400)
RBC: 4.39 MIL/uL (ref 3.87–5.11)
RDW: 12.5 % (ref 11.5–15.5)
WBC: 19.7 10*3/uL — ABNORMAL HIGH (ref 4.0–10.5)

## 2017-07-02 LAB — PREGNANCY, URINE: PREG TEST UR: NEGATIVE

## 2017-07-02 MED ORDER — ONDANSETRON HCL 4 MG/2ML IJ SOLN
4.0000 mg | Freq: Once | INTRAMUSCULAR | Status: AC
  Administered 2017-07-02: 4 mg via INTRAVENOUS
  Filled 2017-07-02: qty 2

## 2017-07-02 MED ORDER — CIPROFLOXACIN HCL 500 MG PO TABS
500.0000 mg | ORAL_TABLET | Freq: Once | ORAL | Status: AC
  Administered 2017-07-02: 500 mg via ORAL
  Filled 2017-07-02: qty 1

## 2017-07-02 MED ORDER — HYDROCODONE-ACETAMINOPHEN 5-325 MG PO TABS: 1.0000 | ORAL_TABLET | ORAL | 0 refills | Status: DC | PRN

## 2017-07-02 MED ORDER — FENTANYL CITRATE (PF) 100 MCG/2ML IJ SOLN
50.0000 ug | Freq: Once | INTRAMUSCULAR | Status: AC
  Administered 2017-07-02: 50 ug via INTRAVENOUS
  Filled 2017-07-02: qty 2

## 2017-07-02 MED ORDER — SODIUM CHLORIDE 0.9 % IV BOLUS (SEPSIS)
1000.0000 mL | Freq: Once | INTRAVENOUS | Status: AC
  Administered 2017-07-02: 1000 mL via INTRAVENOUS

## 2017-07-02 MED ORDER — CIPROFLOXACIN HCL 500 MG PO TABS: 500.0000 mg | ORAL_TABLET | Freq: Two times a day (BID) | ORAL | 0 refills | Status: DC

## 2017-07-02 MED ORDER — MORPHINE SULFATE (PF) 4 MG/ML IV SOLN
4.0000 mg | Freq: Once | INTRAVENOUS | Status: AC
Start: 2017-07-02 — End: 2017-07-02
  Administered 2017-07-02: 4 mg via INTRAVENOUS
  Filled 2017-07-02: qty 1

## 2017-07-02 MED ORDER — ONDANSETRON HCL 4 MG PO TABS: 4.0000 mg | ORAL_TABLET | Freq: Four times a day (QID) | ORAL | 0 refills | Status: DC

## 2017-07-02 MED ORDER — METRONIDAZOLE 500 MG PO TABS
500.0000 mg | ORAL_TABLET | Freq: Once | ORAL | Status: AC
  Administered 2017-07-02: 500 mg via ORAL
  Filled 2017-07-02: qty 1

## 2017-07-02 MED ORDER — IOPAMIDOL (ISOVUE-300) INJECTION 61%
100.0000 mL | Freq: Once | INTRAVENOUS | Status: AC | PRN
  Administered 2017-07-02: 100 mL via INTRAVENOUS

## 2017-07-02 MED ORDER — METRONIDAZOLE 500 MG PO TABS: 500.0000 mg | ORAL_TABLET | Freq: Three times a day (TID) | ORAL | 0 refills | Status: DC

## 2017-07-02 NOTE — ED Notes (Signed)
Pt drinking contrast for ct.

## 2017-07-02 NOTE — Discharge Instructions (Signed)
Your CT scan did not show the cause of your pain. If pain or other symptoms are getting worse, then return to the ED so we can re-evaluate you. If symptoms are not worse, but show no improvement in 24 hours, then return for re-evaluation.  Follow up with your gastroenterologist. You may need to have a colonoscopy.

## 2017-07-02 NOTE — ED Notes (Signed)
C/o abd pain x 8 hours w nausea,  Did enema and stool softer pta to arrival  States did have a bm here w bright red blood in stool  After bm abd pain increased

## 2017-07-02 NOTE — ED Triage Notes (Signed)
Pt c/o LLQ and rectal pain that started tonight around 1800, cannot remember the last time she had a bowel movement, tonight tried miralax and enema without relief, no nausea and vomiting

## 2017-07-02 NOTE — ED Provider Notes (Signed)
[facility] Provider Note   CSN: [id number] Arrival date & time: 07/01/17  2352     History   Chief Complaint Chief Complaint  Patient presents with  . Abdominal Pain    HPI April Sutton is a 41 y.o. female.  The history is provided by the patient.  She complains of abdominal pain since about 6 PM. She initially had a sense that she had to move her bowels but was unable to do so. She tried taking MiraLAX and using an enema. She did have a small amount of stool which did have some streaks of blood. However, during this time, she is also had pain in the right lower abdomen without radiation. There has been some nausea but no vomiting. She denies fever, chills, sweats. She currently puts pain at 4/10. When coming to the ED, if the car hit a bump in the road, pain got significantly worse. She has had episodes of colitis in the past, but this feels different.  Past Medical History:  Diagnosis Date  . Dysmenorrhea   . Endometriosis   . Family history of adverse reaction to anesthesia    paternal grandmother--- hard to wake  . Heavy menstrual period   . Migraine   . PONV (postoperative nausea and vomiting)   . Prolapse of female pelvic organs   . Wears contact lenses   . Wears hearing aid    hearing aid right ear only    Patient Active Problem List   Diagnosis Date Noted  . S/P hysterectomy 11/09/2016  . Endometriosis 06/24/2011  . Vulvar irritation 06/24/2011  . Pelvic pain in female 04/28/2011  . Urinary incontinence 04/28/2011    Past Surgical History:  Procedure Laterality Date  . CYSTOCELE REPAIR N/A 11/09/2016   Procedure: ANTERIOR REPAIR (CYSTOCELE);  Surgeon: Linda Hedges, DO;  Location: [facility];  Service: Gynecology;  Laterality: N/A;  . DX LAPAROSCOPY W/ LASER ABLATION ENDOMETRIOSIS AND UTEROSACRAL NERVE  03-08-2003  dr Gus Height  . LAPAROSCOPIC VAGINAL HYSTERECTOMY WITH SALPINGECTOMY Bilateral 11/09/2016   Procedure:  LAPAROSCOPIC ASSISTED VAGINAL HYSTERECTOMY WITH BILATERAL SALPINGO-OOPHERECTOMY, Anterior Rpr.;  Surgeon: Linda Hedges, DO;  Location: [facility];  Service: Gynecology;  Laterality: Bilateral;    OB History    Gravida Para Term Preterm AB Living   3 3       3    SAB TAB Ectopic Multiple Live Births           3       Home Medications    Prior to Admission medications   Medication Sig Start Date End Date Taking? Authorizing Provider  Ascorbic Acid (VITAMIN C) 1000 MG tablet Take 1,000 mg by mouth daily.    [provider]  aspirin-acetaminophen-caffeine (EXCEDRIN MIGRAINE) [phone] MG tablet Take 2 tablets by mouth every 6 (six) hours as needed for headache or migraine.     [provider]  butalbital-acetaminophen-caffeine (FIORICET, ESGIC) 50-325-40 MG tablet Take 1 tablet by mouth 2 (two) times daily as needed for headache or migraine.     [provider]  Cyanocobalamin (B-12) 1000 MCG CAPS Take 1,000 mcg by mouth daily.     [provider]  ibuprofen (ADVIL,MOTRIN) 800 MG tablet Take 1 tablet (800 mg total) by mouth every 6 (six) hours as needed. 11/10/16   Morris, Jinny Blossom, DO  oxyCODONE-acetaminophen (PERCOCET/ROXICET) 5-325 MG tablet Take 1-2 tablets by mouth every 4 (four) hours as needed (moderate to severe pain (when tolerating fluids)). 11/10/16  Morris, Megan, DO  Probiotic Product (PROBIOTIC DAILY) CAPS Take 1 capsule by mouth daily.    [provider]    Family History Family History  Problem Relation Age of Onset  . Heart attack Father   . Heart disease Father   . Diabetes Maternal Grandmother     Social History Social History  Substance Use Topics  . Smoking status: Never Smoker  . Smokeless tobacco: Never Used  . Alcohol use No     Allergies   Imitrex [sumatriptan] and Latex   Review of Systems Review of Systems  All other systems reviewed and are negative.    Physical Exam Updated Vital  Signs BP 126/74 (BP Location: Right Arm)   Pulse 88   Temp 98.7 F (37.1 C) (Oral)   Resp 16   Ht 5\' 3"  (1.6 m)   Wt 89.4 kg (197 lb)   LMP 10/23/2016 (Approximate)   SpO2 100%   BMI 34.90 kg/m   Physical Exam  Nursing note and vitals reviewed.  41 year old female, resting comfortably and in no acute distress. Vital signs are normal. Oxygen saturation is 100%, which is normal. Head is normocephalic and atraumatic. PERRLA, EOMI. Oropharynx is clear. Neck is nontender and supple without adenopathy or JVD. Back is nontender and there is no CVA tenderness. Lungs are clear without rales, wheezes, or rhonchi. Chest is nontender. Heart has regular rate and rhythm without murmur. Abdomen is soft, flat, with moderate tenderness in the right lower quadrant. There is tenderness to percussion, but no clear rebound tenderness or guarding. There are no masses or hepatosplenomegaly and peristalsis is hypoactive. Extremities have no cyanosis or edema, full range of motion is present. Skin is warm and dry without rash. Neurologic: Mental status is normal, cranial nerves are intact, there are no motor or sensory deficits.  ED Treatments / Results  Labs (all labs ordered are listed, but only abnormal results are displayed) Labs Reviewed  URINALYSIS, ROUTINE W REFLEX MICROSCOPIC - Abnormal; Notable for the following:       Result Value   Specific Gravity, Urine <1.005 (*)    Hgb urine dipstick SMALL (*)    Leukocytes, UA TRACE (*)    All other components within normal limits  CBC WITH DIFFERENTIAL/PLATELET - Abnormal; Notable for the following:    WBC 19.7 (*)    Neutro Abs 17.7 (*)    All other components within normal limits  COMPREHENSIVE METABOLIC PANEL - Abnormal; Notable for the following:    Sodium 133 (*)    Glucose, Bld 121 (*)    Total Protein 8.3 (*)    All other components within normal limits  URINALYSIS, MICROSCOPIC (REFLEX) - Abnormal; Notable for the following:    Bacteria,  UA MANY (*)    Squamous Epithelial / LPF 6-30 (*)    All other components within normal limits  PREGNANCY, URINE   Radiology Ct Abdomen Pelvis W Contrast  Result Date: 07/02/2017 CLINICAL DATA:  Right upper quadrant pain for 12 hours. Constipation and hematuria. EXAM: CT ABDOMEN AND PELVIS WITH CONTRAST TECHNIQUE: Multidetector CT imaging of the abdomen and pelvis was performed using the standard protocol following bolus administration of intravenous contrast. CONTRAST:  180mL ISOVUE-300 IOPAMIDOL (ISOVUE-300) INJECTION 61% COMPARISON:  None. FINDINGS: Lower chest: Lung bases are clear. Hepatobiliary: Mild diffuse fatty infiltration of the liver. No focal liver lesions. Gallbladder and bile ducts are unremarkable. Pancreas: Unremarkable. No pancreatic ductal dilatation or surrounding inflammatory changes. Spleen: Normal in size without  focal abnormality. Adrenals/Urinary Tract: Adrenal glands are unremarkable. Kidneys are normal, without renal calculi, focal lesion, or hydronephrosis. Bladder is unremarkable. Stomach/Bowel: Stomach, small bowel, and colon are not abnormally distended. Contrast material flows through to the cecum without evidence of obstruction. No evidence of significant wall thickening or inflammatory change. Appendix is normal. Vascular/Lymphatic: No significant vascular findings are present. No enlarged abdominal or pelvic lymph nodes. Reproductive: Status post hysterectomy. No adnexal masses. Other: No free air or free fluid in the abdomen. Abdominal wall musculature appears intact. Musculoskeletal: No acute or significant osseous findings. IMPRESSION: 1. No acute process demonstrated in the abdomen or pelvis. No evidence of bowel obstruction or inflammation. 2. Mild diffuse fatty infiltration of the liver. Electronically Signed   By: Lucienne Capers M.D.   On: 07/02/2017 06:12    Procedures Procedures (including critical care time)  Medications Ordered in ED Medications    ondansetron (ZOFRAN) injection 4 mg (4 mg Intravenous Given 07/02/17 0335)  fentaNYL (SUBLIMAZE) injection 50 mcg (50 mcg Intravenous Given 07/02/17 0335)  sodium chloride 0.9 % bolus 1,000 mL (0 mLs Intravenous Stopped 07/02/17 0647)  morphine 4 MG/ML injection 4 mg (4 mg Intravenous Given 07/02/17 0401)  iopamidol (ISOVUE-300) 61 % injection 100 mL (100 mLs Intravenous Contrast Given 07/02/17 0552)  ciprofloxacin (CIPRO) tablet 500 mg (500 mg Oral Given 07/02/17 0648)  metroNIDAZOLE (FLAGYL) tablet 500 mg (500 mg Oral Given 07/02/17 7341)     Initial Impression / Assessment and Plan / ED Course  I have reviewed the triage vital signs and the nursing notes.  Pertinent labs & imaging results that were available during my care of the patient were reviewed by me and considered in my medical decision making (see chart for details).  Abdominal pain with well localized tenderness in the right lower quadrant worrisome for possible appendicitis. Laboratory workup is significant for leukocytosis of 19.7 with 90% neutrophils. Urinalysis is contaminated. She will be given IV fluids and morphine for pain. She has received ondansetron in the ED with significant improvement in nausea. She is being sent for CT of abdomen and pelvis. Old records are reviewed, confirming prior ED visit for colitis.  CT has come back unremarkable. Patient states that pain improved, but is starting to worsen. Reexam shows no evidence of peritonitis. I reviewed her CT scan and an weight about some possible bowel wall thickening in the sigmoid, even the radiologist did not feel that was present. It is felt that patient should be treated with a course of ciprofloxacin and metronidazole. She does need to follow-up with her gastroenterologist. She does have a sibling died of colon cancer in her 45s, and she should have a screening colonoscopy. She is discharged with a prescription for 6 hydrocodone-acetaminophen as well as prescription for  ondansetron in addition to ciprofloxacin and metronidazole. Advised to return in less than 24 hours if symptoms are getting worse, return in 24 hours if symptoms are not improving.  Final Clinical Impressions(s) / ED Diagnoses   Final diagnoses:  RLQ abdominal pain    New Prescriptions New Prescriptions   CIPROFLOXACIN (CIPRO) 500 MG TABLET    Take 1 tablet (500 mg total) by mouth 2 (two) times daily.   HYDROCODONE-ACETAMINOPHEN (NORCO) 5-325 MG TABLET    Take 1 tablet by mouth every 4 (four) hours as needed for moderate pain.   METRONIDAZOLE (FLAGYL) 500 MG TABLET    Take 1 tablet (500 mg total) by mouth 3 (three) times daily.   ONDANSETRON (ZOFRAN) 4  MG TABLET    Take 1 tablet (4 mg total) by mouth every 6 (six) hours.     Delora Fuel, MD 49/20/10 [phone]

## 2017-07-03 ENCOUNTER — Emergency Department (HOSPITAL_BASED_OUTPATIENT_CLINIC_OR_DEPARTMENT_OTHER)
Admission: EM | Admit: 2017-07-03 | Discharge: 2017-07-03 | Disposition: A | Discharge status: Home / Self Care | Payer: 59 | Attending: Emergency Medicine | Admitting: Emergency Medicine

## 2017-07-03 ENCOUNTER — Encounter (HOSPITAL_BASED_OUTPATIENT_CLINIC_OR_DEPARTMENT_OTHER): Payer: Self-pay | Admitting: *Deleted

## 2017-07-03 DIAGNOSIS — K6289 Other specified diseases of anus and rectum: Secondary | ICD-10-CM | POA: Insufficient documentation

## 2017-07-03 DIAGNOSIS — Z79899 Other long term (current) drug therapy: Secondary | ICD-10-CM | POA: Diagnosis not present

## 2017-07-03 NOTE — ED Provider Notes (Signed)
[place] Provider Note   CSN: [id number] Arrival date & time: 07/03/17  1747     History   Chief Complaint Chief Complaint  Patient presents with  . Rectal Pain    HPI April Sutton is a 41 y.o. female.  The history is provided by the patient and medical records.    41 year old female with history of dysmenorrhea, endometriosis, migraine headaches, presenting to the ED with rectal pain.  Patient was seen in the ED Thursday night into early yesterday morning for similar.  She had full workup including labs and CT scan which did not reveal any acute findings. It was felt that she may have a component of colitis and she was started on Cipro/Flagyl combination and encouraged follow-up with GI. There was some stool noted in her colon. She has been using stool softeners since ED visit but has not had a bowel movement. She did take MiraLAX a few days ago that helped her had a bowel movement, but states she is scared to take this. Reports she has had ongoing issues with constipation since her hysterectomy a few years ago. States now she feels "pressure" in her rectum. She continues to feel like she needs to have a bowel movement but has been unable to go. She's not had any fever, chills, nausea, or vomiting. States today she was trying to have a bowel movement and did notice some rectal bleeding.  There were no clots associated. She denies any dizziness or lightheadedness. She is not on anticoagulation.  Past Medical History:  Diagnosis Date  . Dysmenorrhea   . Endometriosis   . Family history of adverse reaction to anesthesia    paternal grandmother--- hard to wake  . Heavy menstrual period   . Migraine   . PONV (postoperative nausea and vomiting)   . Prolapse of female pelvic organs   . Wears contact lenses   . Wears hearing aid    hearing aid right ear only    Patient Active Problem List   Diagnosis Date Noted  . S/P hysterectomy 11/09/2016  . Endometriosis  06/24/2011  . Vulvar irritation 06/24/2011  . Pelvic pain in female 04/28/2011  . Urinary incontinence 04/28/2011    Past Surgical History:  Procedure Laterality Date  . CYSTOCELE REPAIR N/A 11/09/2016   Procedure: ANTERIOR REPAIR (CYSTOCELE);  Surgeon: Linda Hedges, DO;  Location: [facility];  Service: Gynecology;  Laterality: N/A;  . DX LAPAROSCOPY W/ LASER ABLATION ENDOMETRIOSIS AND UTEROSACRAL NERVE  03-08-2003  dr Gus Height  . LAPAROSCOPIC VAGINAL HYSTERECTOMY WITH SALPINGECTOMY Bilateral 11/09/2016   Procedure: LAPAROSCOPIC ASSISTED VAGINAL HYSTERECTOMY WITH BILATERAL SALPINGO-OOPHERECTOMY, Anterior Rpr.;  Surgeon: Linda Hedges, DO;  Location: [facility];  Service: Gynecology;  Laterality: Bilateral;    OB History    Gravida Para Term Preterm AB Living   3 3       3    SAB TAB Ectopic Multiple Live Births           3       Home Medications    Prior to Admission medications   Medication Sig Start Date End Date Taking? Authorizing Provider  Ascorbic Acid (VITAMIN C) 1000 MG tablet Take 1,000 mg by mouth daily.    [provider]  aspirin-acetaminophen-caffeine (EXCEDRIN MIGRAINE) [phone] MG tablet Take 2 tablets by mouth every 6 (six) hours as needed for headache or migraine.     [provider]  butalbital-acetaminophen-caffeine (FIORICET, ESGIC) 50-325-40 MG tablet Take 1  tablet by mouth 2 (two) times daily as needed for headache or migraine.     [provider]  ciprofloxacin (CIPRO) 500 MG tablet Take 1 tablet (500 mg total) by mouth 2 (two) times daily. 5/64/33   Delora Fuel, MD  Cyanocobalamin (B-12) 1000 MCG CAPS Take 1,000 mcg by mouth daily.     [provider]  HYDROcodone-acetaminophen (NORCO) 5-325 MG tablet Take 1 tablet by mouth every 4 (four) hours as needed for moderate pain. 2/95/18   Delora Fuel, MD  ibuprofen (ADVIL,MOTRIN) 800 MG tablet Take 1 tablet (800 mg total) by mouth every 6 (six)  hours as needed. 11/10/16   Morris, Megan, DO  metroNIDAZOLE (FLAGYL) 500 MG tablet Take 1 tablet (500 mg total) by mouth 3 (three) times daily. 8/41/66   Delora Fuel, MD  ondansetron (ZOFRAN) 4 MG tablet Take 1 tablet (4 mg total) by mouth every 6 (six) hours. 0/63/01   Delora Fuel, MD  oxyCODONE-acetaminophen (PERCOCET/ROXICET) 5-325 MG tablet Take 1-2 tablets by mouth every 4 (four) hours as needed (moderate to severe pain (when tolerating fluids)). 11/10/16   Linda Hedges, DO  Probiotic Product (PROBIOTIC DAILY) CAPS Take 1 capsule by mouth daily.    [provider]    Family History Family History  Problem Relation Age of Onset  . Heart attack Father   . Heart disease Father   . Diabetes Maternal Grandmother     Social History Social History  Substance Use Topics  . Smoking status: Never Smoker  . Smokeless tobacco: Never Used  . Alcohol use No     Allergies   Imitrex [sumatriptan] and Latex   Review of Systems Review of Systems  Gastrointestinal: Positive for anal bleeding and rectal pain.  All other systems reviewed and are negative.    Physical Exam Updated Vital Signs BP 112/69 (BP Location: Right Arm)   Pulse 69   Temp 98.3 F (36.8 C) (Oral)   Resp 16   LMP 10/23/2016 (Approximate) Comment: w/ bilat oophorectomies//a.c.  SpO2 100%   Physical Exam  Constitutional: She is oriented to person, place, and time. She appears well-developed and well-nourished.  HENT:  Head: Normocephalic and atraumatic.  Mouth/Throat: Oropharynx is clear and moist.  Eyes: Pupils are equal, round, and reactive to light. Conjunctivae and EOM are normal.  Neck: Normal range of motion.  Cardiovascular: Normal rate, regular rhythm and normal heart sounds.   Pulmonary/Chest: Effort normal and breath sounds normal. No respiratory distress. She has no wheezes.  Abdominal: Soft. Bowel sounds are normal.  Genitourinary:  Genitourinary Comments: Rectum with small external  hemorrhoid at the 3:00 position, there is no active bleeding, rectal exam performed without fecal impaction, there is small speck of blood noted on DRE, no gross hematochezia or melena  Musculoskeletal: Normal range of motion.  Neurological: She is alert and oriented to person, place, and time.  Skin: Skin is warm and dry.  Psychiatric: She has a normal mood and affect.  Nursing note and vitals reviewed.    ED Treatments / Results  Labs (all labs ordered are listed, but only abnormal results are displayed) Labs Reviewed - No data to display  EKG  EKG Interpretation None       Radiology Ct Abdomen Pelvis W Contrast  Result Date: 07/02/2017 CLINICAL DATA:  Right upper quadrant pain for 12 hours. Constipation and hematuria. EXAM: CT ABDOMEN AND PELVIS WITH CONTRAST TECHNIQUE: Multidetector CT imaging of the abdomen and pelvis was performed using the standard protocol  following bolus administration of intravenous contrast. CONTRAST:  187mL ISOVUE-300 IOPAMIDOL (ISOVUE-300) INJECTION 61% COMPARISON:  None. FINDINGS: Lower chest: Lung bases are clear. Hepatobiliary: Mild diffuse fatty infiltration of the liver. No focal liver lesions. Gallbladder and bile ducts are unremarkable. Pancreas: Unremarkable. No pancreatic ductal dilatation or surrounding inflammatory changes. Spleen: Normal in size without focal abnormality. Adrenals/Urinary Tract: Adrenal glands are unremarkable. Kidneys are normal, without renal calculi, focal lesion, or hydronephrosis. Bladder is unremarkable. Stomach/Bowel: Stomach, small bowel, and colon are not abnormally distended. Contrast material flows through to the cecum without evidence of obstruction. No evidence of significant wall thickening or inflammatory change. Appendix is normal. Vascular/Lymphatic: No significant vascular findings are present. No enlarged abdominal or pelvic lymph nodes. Reproductive: Status post hysterectomy. No adnexal masses. Other: No free air  or free fluid in the abdomen. Abdominal wall musculature appears intact. Musculoskeletal: No acute or significant osseous findings. IMPRESSION: 1. No acute process demonstrated in the abdomen or pelvis. No evidence of bowel obstruction or inflammation. 2. Mild diffuse fatty infiltration of the liver. Electronically Signed   By: Lucienne Capers M.D.   On: 07/02/2017 06:12    Procedures Procedures (including critical care time)  Medications Ordered in ED Medications - No data to display   Initial Impression / Assessment and Plan / ED Course  I have reviewed the triage vital signs and the nursing notes.  Pertinent labs & imaging results that were available during my care of the patient were reviewed by me and considered in my medical decision making (see chart for details).  41 year old female here with rectal pain and concern of rectal bleeding. Seen in the ED recently, discharged yesterday morning for same. CT scan with some colonic stool but no other acute findings. Was started on Cipro/Flagyl by previous provider with concern of colitis. Reports she has not had a bowel movement since, but did notice some rectal bleeding when trying to go. She is afebrile and nontoxic. Her vitals are stable. Abdomen is soft and benign. Rectal exam does reveal a small hemorrhoid at the 3:00 positions that is not actively bleeding does not appear thrombosed. Rectal exam was performed, no fecal impaction. Small speck of bright red blood noted on exam. No gross hematochezia or melena. I suspect she may have some mild bleeding from possible colitis versus constipation. I do not suspect significant GI bleed at this time. Hemodynamically stable.  She is not had good results from stool softener, recommended switch to MiraLAX. Continue antibiotics and other medications. Close follow-up with her GI doctor.  Discussed plan with patient, she acknowledged understanding and agreed with plan of care.  Return precautions given for  new or worsening symptoms.  Final Clinical Impressions(s) / ED Diagnoses   Final diagnoses:  Rectal pain    New Prescriptions Discharge Medication List as of 07/03/2017  8:40 PM       Larene Pickett, PA-C 07/03/17 2117    Varney Biles, MD 07/04/17 8828

## 2017-07-03 NOTE — ED Triage Notes (Signed)
Pt states that she is having pain in her rectal area and abdomen. Pt says that she has dark red blood when she is trying to have a BM. Last BM was on Thursday night when she was here. Has been taking stool softeners, antibiotics, nausea and pain meds as directed.

## 2017-07-03 NOTE — Discharge Instructions (Signed)
I would continue all of your medications from prior visit including antibiotics. Would recommend to start MiraLAX, can start with 1 capful daily, but if not getting any good results with this can increase to 2 capsules. I would follow-up closely with your GI doctor. Can also touch base with your primary care doctor. Return here for any new or worsening symptoms.

## 2017-07-30 DIAGNOSIS — K625 Hemorrhage of anus and rectum: Secondary | ICD-10-CM | POA: Diagnosis not present

## 2017-07-30 DIAGNOSIS — R1084 Generalized abdominal pain: Secondary | ICD-10-CM | POA: Diagnosis not present

## 2017-07-30 DIAGNOSIS — K5900 Constipation, unspecified: Secondary | ICD-10-CM | POA: Diagnosis not present

## 2017-08-08 DIAGNOSIS — Z23 Encounter for immunization: Secondary | ICD-10-CM | POA: Diagnosis not present

## 2017-08-27 DIAGNOSIS — K921 Melena: Secondary | ICD-10-CM | POA: Diagnosis not present

## 2017-08-27 DIAGNOSIS — R1084 Generalized abdominal pain: Secondary | ICD-10-CM | POA: Diagnosis not present

## 2017-08-31 DIAGNOSIS — Z808 Family history of malignant neoplasm of other organs or systems: Secondary | ICD-10-CM | POA: Diagnosis not present

## 2017-08-31 DIAGNOSIS — Z01419 Encounter for gynecological examination (general) (routine) without abnormal findings: Secondary | ICD-10-CM | POA: Diagnosis not present

## 2017-08-31 DIAGNOSIS — Z803 Family history of malignant neoplasm of breast: Secondary | ICD-10-CM | POA: Diagnosis not present

## 2017-08-31 DIAGNOSIS — Z800 Family history of malignant neoplasm of digestive organs: Secondary | ICD-10-CM | POA: Diagnosis not present

## 2018-01-07 DIAGNOSIS — J029 Acute pharyngitis, unspecified: Secondary | ICD-10-CM | POA: Diagnosis not present

## 2018-02-02 DIAGNOSIS — D1801 Hemangioma of skin and subcutaneous tissue: Secondary | ICD-10-CM | POA: Diagnosis not present

## 2018-02-02 DIAGNOSIS — D225 Melanocytic nevi of trunk: Secondary | ICD-10-CM | POA: Diagnosis not present

## 2018-02-02 DIAGNOSIS — L814 Other melanin hyperpigmentation: Secondary | ICD-10-CM | POA: Diagnosis not present

## 2018-04-05 DIAGNOSIS — L237 Allergic contact dermatitis due to plants, except food: Secondary | ICD-10-CM | POA: Diagnosis not present

## 2018-04-19 DIAGNOSIS — G43909 Migraine, unspecified, not intractable, without status migrainosus: Secondary | ICD-10-CM | POA: Diagnosis not present

## 2018-04-19 DIAGNOSIS — Z13220 Encounter for screening for lipoid disorders: Secondary | ICD-10-CM | POA: Diagnosis not present

## 2018-04-19 DIAGNOSIS — Z0000 Encounter for general adult medical examination without abnormal findings: Secondary | ICD-10-CM | POA: Diagnosis not present

## 2018-06-23 ENCOUNTER — Encounter: Payer: Self-pay | Admitting: Neurology

## 2018-08-01 DIAGNOSIS — Z23 Encounter for immunization: Secondary | ICD-10-CM | POA: Diagnosis not present

## 2018-08-19 ENCOUNTER — Ambulatory Visit (INDEPENDENT_AMBULATORY_CARE_PROVIDER_SITE_OTHER): Payer: 59 | Admitting: Neurology

## 2018-08-19 ENCOUNTER — Encounter

## 2018-08-19 ENCOUNTER — Encounter: Payer: Self-pay | Admitting: Neurology

## 2018-08-19 VITALS — BP 126/92 | HR 76 | Ht 62.0 in | Wt 219.0 lb

## 2018-08-19 DIAGNOSIS — R42 Dizziness and giddiness: Secondary | ICD-10-CM | POA: Diagnosis not present

## 2018-08-19 DIAGNOSIS — G43109 Migraine with aura, not intractable, without status migrainosus: Secondary | ICD-10-CM | POA: Diagnosis not present

## 2018-08-19 DIAGNOSIS — H9191 Unspecified hearing loss, right ear: Secondary | ICD-10-CM

## 2018-08-19 DIAGNOSIS — H8190 Unspecified disorder of vestibular function, unspecified ear: Secondary | ICD-10-CM

## 2018-08-19 NOTE — Progress Notes (Signed)
NEUROLOGY CONSULTATION NOTE  CHRISLYNN MOSELY MRN: [id number] DOB: 19-Jun-1976  Referring provider: Donald Prose, MD Primary care provider: Donald Prose, MD  Reason for consult:  dizziness  HISTORY OF PRESENT ILLNESS: April Sutton is a 42 year old right-handed female with IBS, right-sided hearing loss, migraine and endometriosis who presents for migraines.  History supplemented by referring providers note.  Two years ago, she lost hearing suddenly in the right ear.  She saw ENT who couldn't find a specific cause.  She has a hearing aid.  She started having dizzy spells in the end of September.  She was laying down on the couch watching TV.  When she got up, it hit.  At that time, she developed spinning sensation with nausea.  There was no vomiting, visual disturbance, headache or unilateral numbness and weakness.  It was aggravated by movement.  She laid down for 30 minutes and it improved but still felt a little dizzy for the rest of the day.  Since then, she has had recurrent spells, up to twice a week.  With subsequent spells, she notes spinning sensation but she is able to suppress it by limiting movement because she can feel it "coming on".  She will typically take a Dramamine and rest and it resolves after 30 minutes.  Onset may be with change in position or spontaneous.  She denies any upper viral symptoms.  She denies depression and anxiety.  If she rolls over on her left side, she notes that sound in that ear becomes muffled.  However, hearing in her left ear is okay on testing.  She denies tinnitus.  She does have migraines associated with aura (sees a "wall" over her left eye) with severe headache, nausea, photophobia and phonophobia.  Migraines now occur 1 to 2 a week but sometimes it may be several weeks between episodes.  Current NSAIDS: Ibuprofen (rarely) Current analgesics: Excedrin Migraine, Fioricet Current Vitamins/Herbal/Supplements:  MVI  Past abortive triptans:  Sumatriptan (made headaches worse) Past anticonvulsant medications: Topiramate (caused cognitive deficits and paresthesias)  04/19/2018 labs: CBC and CMP unremarkable except for mildly elevated WBC of 13.1.  PAST MEDICAL HISTORY: Past Medical History:  Diagnosis Date  . Dysmenorrhea   . Endometriosis   . Family history of adverse reaction to anesthesia    paternal grandmother--- hard to wake  . Heavy menstrual period   . Migraine   . PONV (postoperative nausea and vomiting)   . Prolapse of female pelvic organs   . Wears contact lenses   . Wears hearing aid    hearing aid right ear only    PAST SURGICAL HISTORY: Past Surgical History:  Procedure Laterality Date  . CYSTOCELE REPAIR N/A 11/09/2016   Procedure: ANTERIOR REPAIR (CYSTOCELE);  Surgeon: Linda Hedges, DO;  Location[facility]  Service: Gynecology;  Laterality: N/A;  . DX LAPAROSCOPY W/ LASER ABLATION ENDOMETRIOSIS AND UTEROSACRAL NERVE  03-08-2003  dr Gus Height  . LAPAROSCOPIC VAGINAL HYSTERECTOMY WITH SALPINGECTOMY Bilateral 11/09/2016   Procedure: LAPAROSCOPIC ASSISTED VAGINAL HYSTERECTOMY WITH BILATERAL SALPINGO-OOPHERECTOMY, Anterior Rpr.;  Surgeon: Linda Hedges, DO;  Location: [facility];  Service: Gynecology;  Laterality: Bilateral;    MEDICATIONS: Current Outpatient Medications on File Prior to Visit  Medication Sig Dispense Refill  . Ascorbic Acid (VITAMIN C) 1000 MG tablet Take 1,000 mg by mouth daily.    Marland Kitchen aspirin-acetaminophen-caffeine (EXCEDRIN MIGRAINE) 250-250-65 MG tablet Take 2 tablets by mouth every 6 (six) hours as needed for headache or migraine.     Marland Kitchen  butalbital-acetaminophen-caffeine (FIORICET, ESGIC) 50-325-40 MG tablet Take 1 tablet by mouth 2 (two) times daily as needed for headache or migraine.     . ciprofloxacin (CIPRO) 500 MG tablet Take 1 tablet (500 mg total) by mouth 2 (two) times daily. 20 tablet 0  . Cyanocobalamin (B-12) 1000 MCG CAPS Take 1,000 mcg by  mouth daily.     Marland Kitchen HYDROcodone-acetaminophen (NORCO) 5-325 MG tablet Take 1 tablet by mouth every 4 (four) hours as needed for moderate pain. 6 tablet 0  . ibuprofen (ADVIL,MOTRIN) 800 MG tablet Take 1 tablet (800 mg total) by mouth every 6 (six) hours as needed. 60 tablet 0  . metroNIDAZOLE (FLAGYL) 500 MG tablet Take 1 tablet (500 mg total) by mouth 3 (three) times daily. 30 tablet 0  . ondansetron (ZOFRAN) 4 MG tablet Take 1 tablet (4 mg total) by mouth every 6 (six) hours. 12 tablet 0  . oxyCODONE-acetaminophen (PERCOCET/ROXICET) 5-325 MG tablet Take 1-2 tablets by mouth every 4 (four) hours as needed (moderate to severe pain (when tolerating fluids)). 30 tablet 0  . Probiotic Product (PROBIOTIC DAILY) CAPS Take 1 capsule by mouth daily.     No current facility-administered medications on file prior to visit.     ALLERGIES: Allergies  Allergen Reactions  . Imitrex [Sumatriptan] Other (See Comments)    Adverse reaction--  "made pain 10 times worse"  . Latex Rash    FAMILY HISTORY: Family History  Problem Relation Age of Onset  . Heart attack Father   . Heart disease Father   . Diabetes Maternal Grandmother    SOCIAL HISTORY: Social History   Socioeconomic History  . Marital status: Married    Spouse name: Not on file  . Number of children: Not on file  . Years of education: Not on file  . Highest education level: Not on file  Occupational History  . Not on file  Social Needs  . Financial resource strain: Not on file  . Food insecurity:    Worry: Not on file    Inability: Not on file  . Transportation needs:    Medical: Not on file    Non-medical: Not on file  Tobacco Use  . Smoking status: Never Smoker  . Smokeless tobacco: Never Used  Substance and Sexual Activity  . Alcohol use: No  . Drug use: No  . Sexual activity: Yes    Birth control/protection: Other-see comments    Comment: HUSBAND VASECTOMY  Lifestyle  . Physical activity:    Days per week: Not on  file    Minutes per session: Not on file  . Stress: Not on file  Relationships  . Social connections:    Talks on phone: Not on file    Gets together: Not on file    Attends religious service: Not on file    Active member of club or organization: Not on file    Attends meetings of clubs or organizations: Not on file    Relationship status: Not on file  . Intimate partner violence:    Fear of current or ex partner: Not on file    Emotionally abused: Not on file    Physically abused: Not on file    Forced sexual activity: Not on file  Other Topics Concern  . Not on file  Social History Narrative  . Not on file    REVIEW OF SYSTEMS: Constitutional: No fevers, chills, or sweats, no generalized fatigue, change in appetite Eyes: No visual changes, double vision,  eye pain Ear, nose and throat: No hearing loss, ear pain, nasal congestion, sore throat Cardiovascular: No chest pain, palpitations Respiratory:  No shortness of breath at rest or with exertion, wheezes GastrointestinaI: No nausea, vomiting, diarrhea, abdominal pain, fecal incontinence Genitourinary:  No dysuria, urinary retention or frequency Musculoskeletal:  No neck pain, back pain Integumentary: No rash, pruritus, skin lesions Neurological: as above Psychiatric: No depression, insomnia, anxiety Endocrine: No palpitations, fatigue, diaphoresis, mood swings, change in appetite, change in weight, increased thirst Hematologic/Lymphatic:  No purpura, petechiae. Allergic/Immunologic: no itchy/runny eyes, nasal congestion, recent allergic reactions, rashes  PHYSICAL EXAM: Blood pressure (!) 126/92, pulse 76, height 5\' 2"  (1.575 m), weight 219 lb (99.3 kg), last menstrual period 10/23/2016, SpO2 98 %. General: No acute distress.  Patient appears well-groomed.  Head:  Normocephalic/atraumatic Eyes:  fundi examined but not visualized Neck: supple, no paraspinal tenderness, full range of motion Back: No paraspinal  tenderness Heart: regular rate and rhythm Lungs: Clear to auscultation bilaterally. Vascular: No carotid bruits. Neurological Exam: Mental status: alert and oriented to person, place, and time, recent and remote memory intact, fund of knowledge intact, attention and concentration intact, speech fluent and not dysarthric, language intact. Cranial nerves: CN I: not tested CN II: pupils equal, round and reactive to light, visual fields intact CN III, IV, VI:  full range of motion, no nystagmus, no ptosis CN V: facial sensation intact CN VII: upper and lower face symmetric CN VIII: hearing intact CN IX, X: gag intact, uvula midline CN XI: sternocleidomastoid and trapezius muscles intact CN XII: tongue midline Bulk & Tone: normal, no fasciculations. Motor:  5/5 throughout  Sensation: temperature and vibration sensation intact. Deep Tendon Reflexes:  3+ throughout, toes downgoing.  Finger to nose testing:  Without dysmetria.  Heel to shin:  Without dysmetria.  Gait:  Normal station and stride.  Able to turn and tandem walk. Romberg negative.  IMPRESSION: 1.  Recurrent episodic vertigo 2.  Hearing loss in right ear. 3.  Migraine with aura, without status migrainosus, not intractable 4.  Morbid obesity (BMI 40.06 kg/m2)  Recurrent episodic vertigo may still be BPPV or migraine-related.  However, given her unexplained hearing loss, imaging of brain and internal auditory canals recommended.  PLAN: 1.  We will order MRI of the brain and internal auditory canals with and without contrast to evaluate for secondary etiology of right-sided hearing loss and vertigo. 2.  She will continue her current abortive therapy for dizzy spells.   3.  We discussed potential preventative medications for these dizzy spells, such as antidepressants or possibly extended release topiramate (which less likely causes side effects such as cognitive impairment and paresthesias).  She defers at this time. 4.   Recommend weight loss 5.  Further recommendations pending results.  Otherwise, she may follow-up as needed if she wishes to pursue further management of these spells.  Thank you for allowing me to take part in the care of this patient.  Metta Clines, DO  CC: Donald Prose, MD

## 2018-08-19 NOTE — Patient Instructions (Addendum)
Dizzy spells may be inner ear vertigo but it may also be migraine-related 1.  We will check MRI of brain and internal auditory canals with and without contrast.  I will contact you with results and any further recommendations 2.  If you wish to consider any daily preventative medication (such as anti-seizure or antidepressant) then please make a follow up appointment.  We have sent a referral to [facility] for your MRI and they will call you directly to schedule your appt. They are located at [place]. If you need to contact them directly please call [phone].

## 2018-09-05 ENCOUNTER — Ambulatory Visit
Admission: RE | Admit: 2018-09-05 | Discharge: 2018-09-05 | Disposition: A | Discharge status: Home / Self Care | Payer: 59 | Source: Ambulatory Visit | Attending: Neurology | Admitting: Neurology

## 2018-09-05 DIAGNOSIS — H8190 Unspecified disorder of vestibular function, unspecified ear: Secondary | ICD-10-CM

## 2018-09-05 DIAGNOSIS — R42 Dizziness and giddiness: Secondary | ICD-10-CM | POA: Diagnosis not present

## 2018-09-05 DIAGNOSIS — H9191 Unspecified hearing loss, right ear: Secondary | ICD-10-CM

## 2018-09-05 MED ORDER — GADOBENATE DIMEGLUMINE 529 MG/ML IV SOLN
20.0000 mL | Freq: Once | INTRAVENOUS | Status: AC | PRN
  Administered 2018-09-05: 20 mL via INTRAVENOUS

## 2018-09-06 ENCOUNTER — Telehealth: Payer: Self-pay | Admitting: *Deleted

## 2018-09-06 NOTE — Telephone Encounter (Signed)
Patient given results and referral sent to [facility] ENT.

## 2018-09-06 NOTE — Telephone Encounter (Signed)
-----   Message from Chester Holstein, LPN sent at [phone]  1:53 PM EST -----   ----- Message ----- From: Pieter Partridge, DO Sent: 09/06/2018  12:37 PM EST To: Clois Comber, CMA  MRI of brain is normal.  However, there is abnormal signal in the left mastoid air cells (one of the sinuses behind the ear).  It may be of no clinical significance but given her symptoms, she may want to follow up with ENT.

## 2018-09-11 IMAGING — CT CT ABD-PELV W/ CM
2 of 5 series · 16 of 46 positions shown, 18 images · IV contrast (APPLIED)
Comparison: None.

CLINICAL DATA: Right upper quadrant pain for 12 hours. Constipation
and hematuria.

EXAM:
CT ABDOMEN AND PELVIS WITH CONTRAST
TECHNIQUE: Multidetector CT imaging of the abdomen and pelvis was performed
using the standard protocol following bolus administration of
intravenous contrast.
CONTRAST:  100mL [id number]-IGG IOPAMIDOL ([id number]-IGG) INJECTION 61%

[Series 2: axial st · axial · 0.83mm/px · z∈[+418,+852]mm · 13 of 99 slices shown, 15 images]
[im 6/99  soft-tissue]
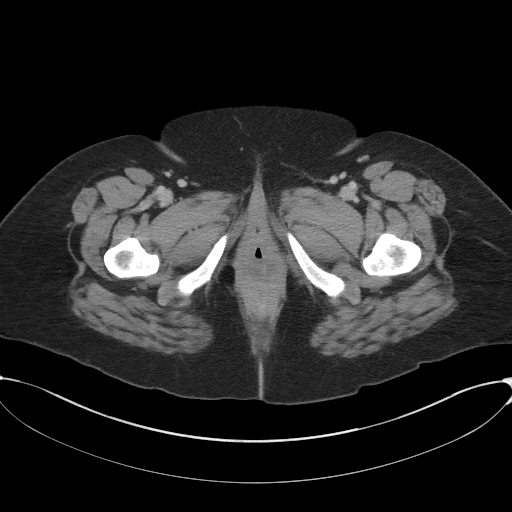
[im 6/99  bone]
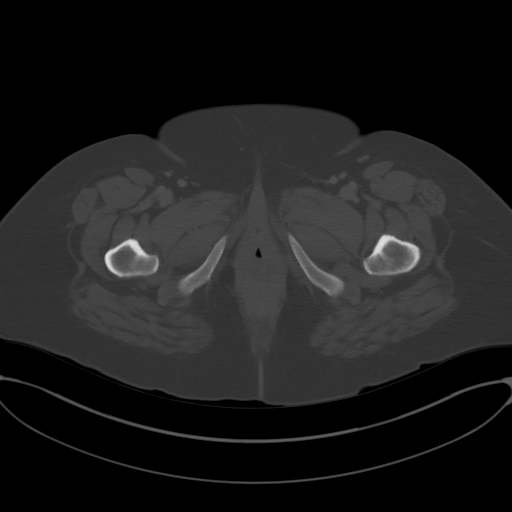
[im 11/99  soft-tissue]
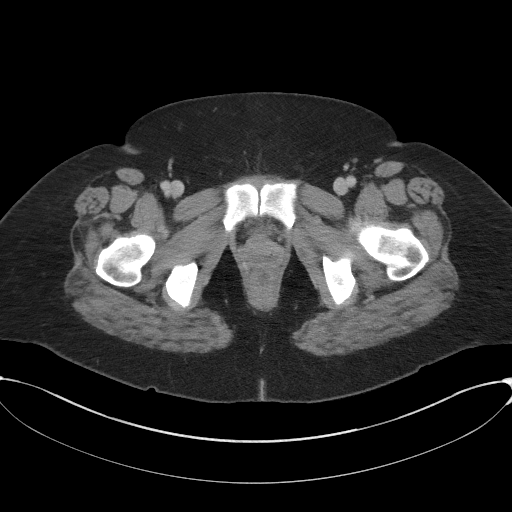
[im 22/99  soft-tissue]
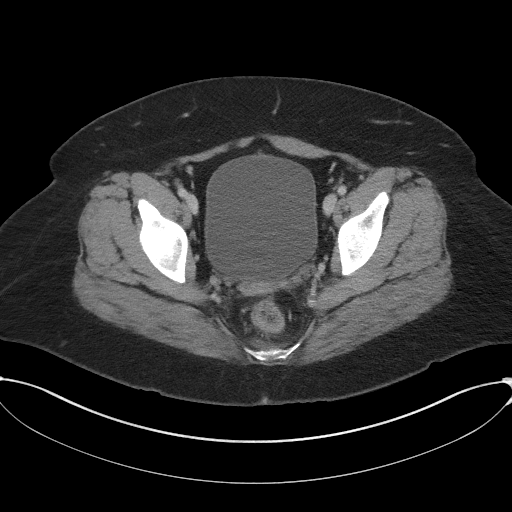
[im 28/99  soft-tissue]
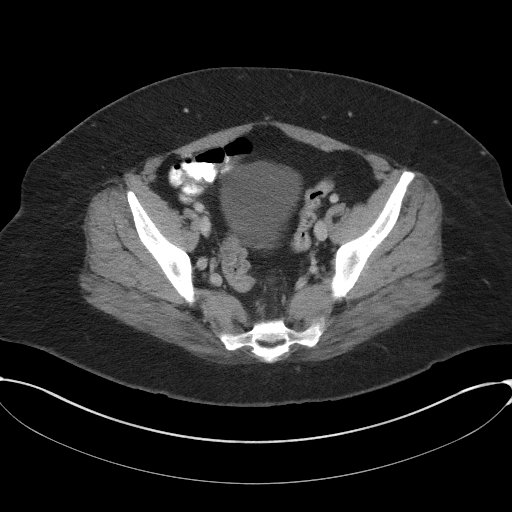
[im 33/99  soft-tissue]
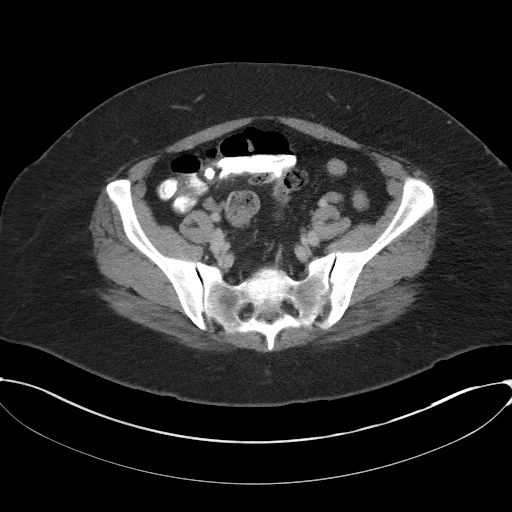
[im 44/99  soft-tissue]
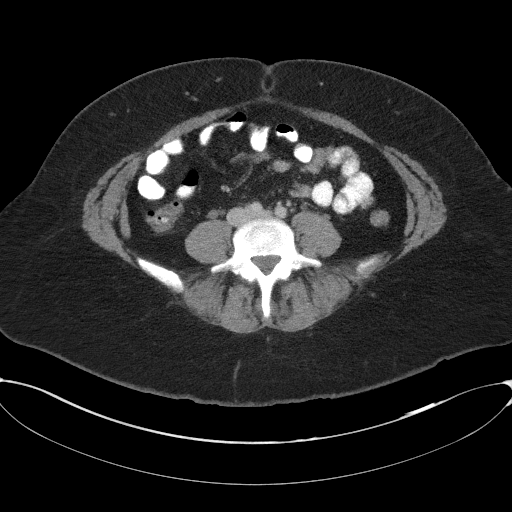
[im 50/99  soft-tissue]
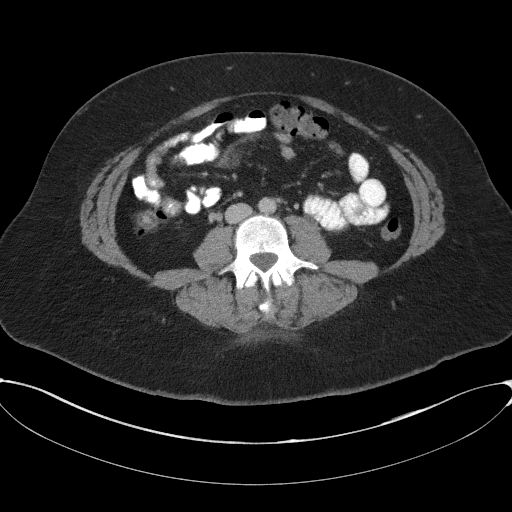
[im 55/99  soft-tissue]
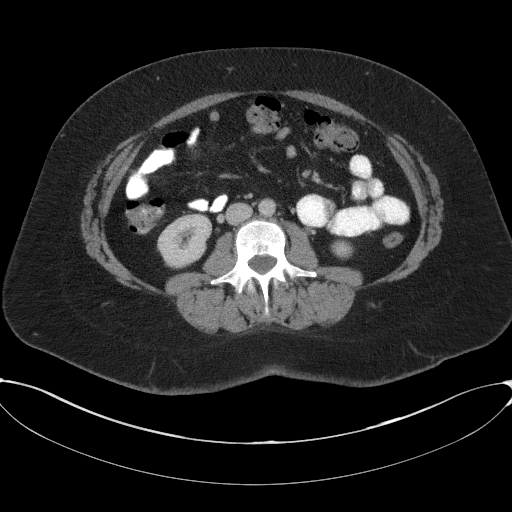
[im 66/99  soft-tissue]
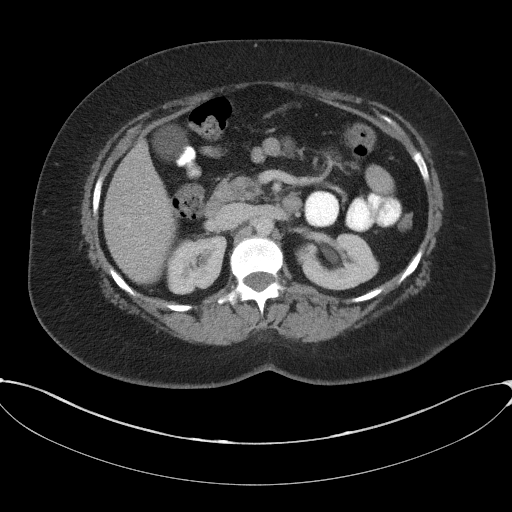
[im 66/99  bone]
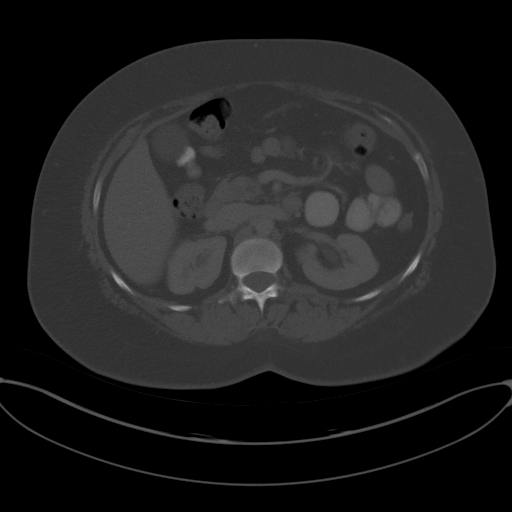
[im 71/99  soft-tissue]
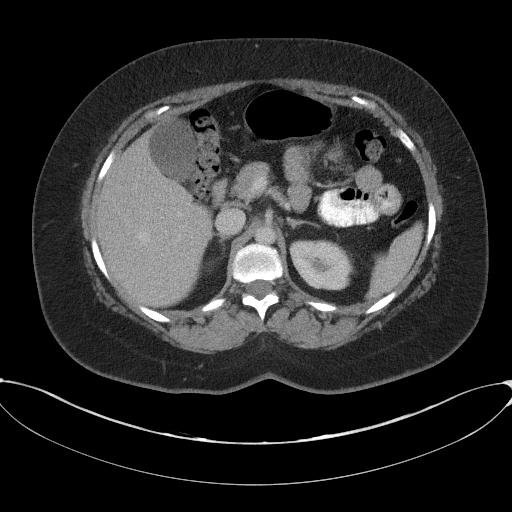
[im 77/99  soft-tissue]
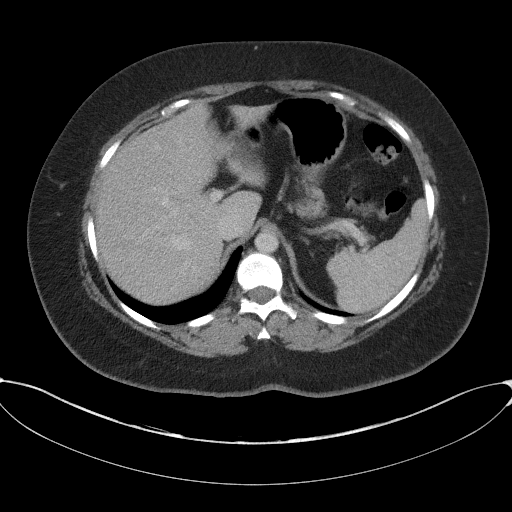
[im 88/99  soft-tissue]
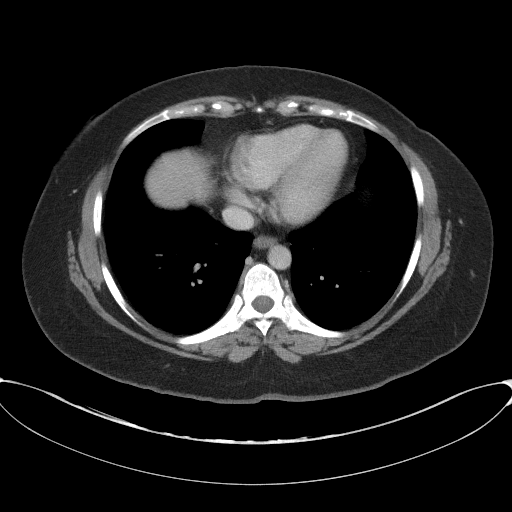
[im 93/99  soft-tissue]
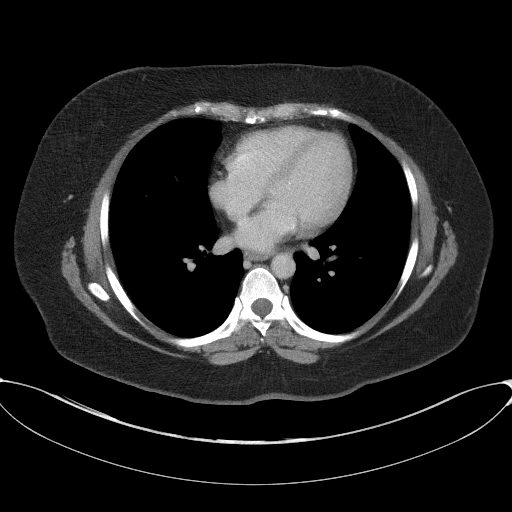

[Series 5: coronal st · coronal · 0.80mm/px · 3 of 88 slices shown]
[im 30/88  soft-tissue]
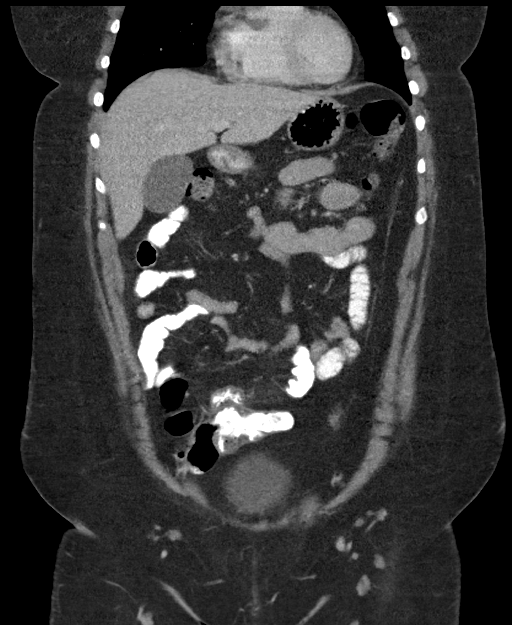
[im 39/88  soft-tissue]
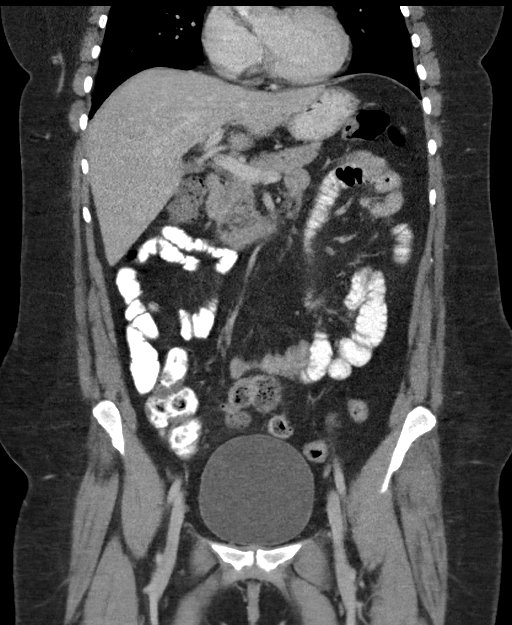
[im 49/88  soft-tissue]
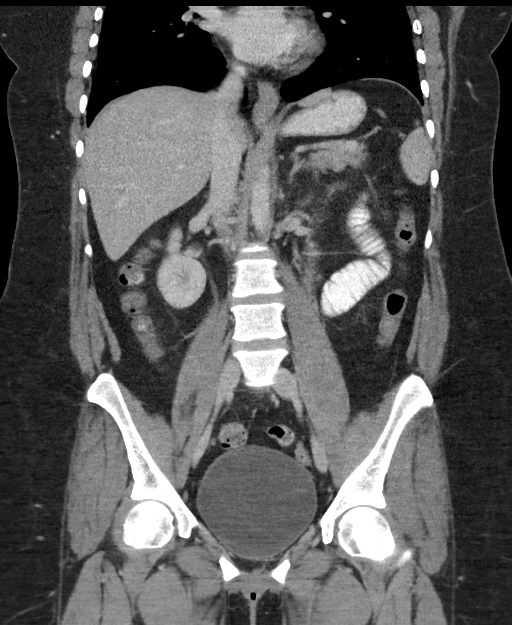

[16 of 46 positions shown; findings below may reference images not displayed]

FINDINGS: Lower chest: Lung bases are clear.

Hepatobiliary: Mild diffuse fatty infiltration of the liver. No
focal liver lesions. Gallbladder and bile ducts are unremarkable.

Pancreas: Unremarkable. No pancreatic ductal dilatation or
surrounding inflammatory changes.

Spleen: Normal in size without focal abnormality.

Adrenals/Urinary Tract: Adrenal glands are unremarkable. Kidneys are
normal, without renal calculi, focal lesion, or hydronephrosis.
Bladder is unremarkable.

Stomach/Bowel: Stomach, small bowel, and colon are not abnormally
distended. Contrast material flows through to the cecum without
evidence of obstruction. No evidence of significant wall thickening
or inflammatory change. Appendix is normal.

Vascular/Lymphatic: No significant vascular findings are present. No
enlarged abdominal or pelvic lymph nodes.

Reproductive: Status post hysterectomy. No adnexal masses.

Other: No free air or free fluid in the abdomen. Abdominal wall
musculature appears intact.

Musculoskeletal: No acute or significant osseous findings.
IMPRESSION: 1. No acute process demonstrated in the abdomen or pelvis. No
evidence of bowel obstruction or inflammation.
2. Mild diffuse fatty infiltration of the liver.

## 2018-09-13 DIAGNOSIS — Z6839 Body mass index (BMI) 39.0-39.9, adult: Secondary | ICD-10-CM | POA: Diagnosis not present

## 2018-09-13 DIAGNOSIS — Z01419 Encounter for gynecological examination (general) (routine) without abnormal findings: Secondary | ICD-10-CM | POA: Diagnosis not present

## 2018-09-19 DIAGNOSIS — R42 Dizziness and giddiness: Secondary | ICD-10-CM | POA: Diagnosis not present

## 2018-09-19 DIAGNOSIS — H9011 Conductive hearing loss, unilateral, right ear, with unrestricted hearing on the contralateral side: Secondary | ICD-10-CM | POA: Diagnosis not present

## 2020-01-13 ENCOUNTER — Encounter (HOSPITAL_COMMUNITY): Payer: Self-pay | Admitting: *Deleted

## 2020-01-13 ENCOUNTER — Emergency Department (HOSPITAL_COMMUNITY)
Admission: EM | Admit: 2020-01-13 | Discharge: 2020-01-13 | Disposition: A | Discharge status: Home / Self Care | Payer: 59 | Attending: Emergency Medicine | Admitting: Emergency Medicine

## 2020-01-13 ENCOUNTER — Other Ambulatory Visit: Payer: Self-pay

## 2020-01-13 DIAGNOSIS — K529 Noninfective gastroenteritis and colitis, unspecified: Secondary | ICD-10-CM | POA: Diagnosis not present

## 2020-01-13 DIAGNOSIS — R1030 Lower abdominal pain, unspecified: Secondary | ICD-10-CM | POA: Insufficient documentation

## 2020-01-13 DIAGNOSIS — R42 Dizziness and giddiness: Secondary | ICD-10-CM | POA: Insufficient documentation

## 2020-01-13 DIAGNOSIS — R112 Nausea with vomiting, unspecified: Secondary | ICD-10-CM | POA: Diagnosis present

## 2020-01-13 LAB — COMPREHENSIVE METABOLIC PANEL
ALT: 31 U/L (ref 0–44)
AST: 27 U/L (ref 15–41)
Albumin: 3.7 g/dL (ref 3.5–5.0)
Alkaline Phosphatase: 75 U/L (ref 38–126)
Anion gap: 12 (ref 5–15)
BUN: 15 mg/dL (ref 6–20)
CO2: 26 mmol/L (ref 22–32)
Calcium: 9.4 mg/dL (ref 8.9–10.3)
Chloride: 103 mmol/L (ref 98–111)
Creatinine, Ser: 0.8 mg/dL (ref 0.44–1.00)
GFR calc Af Amer: >60 mL/min (ref >60)
GFR calc non Af Amer: >60 mL/min (ref >60)
Glucose, Bld: 107 mg/dL — ABNORMAL HIGH (ref 70–99)
Potassium: 4.4 mmol/L (ref 3.5–5.1)
Sodium: 141 mmol/L (ref 135–145)
Total Bilirubin: 0.7 mg/dL (ref 0.3–1.2)
Total Protein: 7.1 g/dL (ref 6.5–8.1)

## 2020-01-13 LAB — CBC WITH DIFFERENTIAL/PLATELET
Abs Immature Granulocytes: 0.09 10*3/uL — ABNORMAL HIGH (ref 0.00–0.07)
Basophils Absolute: 0.1 10*3/uL (ref 0.0–0.1)
Basophils Relative: 0 %
Eosinophils Absolute: 0.2 10*3/uL (ref 0.0–0.5)
Eosinophils Relative: 1 %
HCT: 45.6 % (ref 36.0–46.0)
Hemoglobin: 14.8 g/dL (ref 12.0–15.0)
Immature Granulocytes: 1 %
Lymphocytes Relative: 5 %
Lymphs Abs: 1.1 10*3/uL (ref 0.7–4.0)
MCH: 30.1 pg (ref 26.0–34.0)
MCHC: 32.5 g/dL (ref 30.0–36.0)
MCV: 92.9 fL (ref 80.0–100.0)
Monocytes Absolute: 0.9 10*3/uL (ref 0.1–1.0)
Monocytes Relative: 5 %
Neutro Abs: 17.4 10*3/uL — ABNORMAL HIGH (ref 1.7–7.7)
Neutrophils Relative %: 88 %
Platelets: 291 10*3/uL (ref 150–400)
RBC: 4.91 MIL/uL (ref 3.87–5.11)
RDW: 11.9 % (ref 11.5–15.5)
WBC: 19.7 10*3/uL — ABNORMAL HIGH (ref 4.0–10.5)
nRBC: 0 % (ref 0.0–0.2)

## 2020-01-13 LAB — I-STAT BETA HCG BLOOD, ED (MC, WL, AP ONLY): I-stat hCG, quantitative: <5 m[IU]/mL (ref <5)

## 2020-01-13 LAB — LIPASE, BLOOD: Lipase: 22 U/L (ref 11–51)

## 2020-01-13 MED ORDER — PROMETHAZINE HCL 25 MG/ML IJ SOLN
12.5000 mg | Freq: Once | INTRAMUSCULAR | Status: AC
  Administered 2020-01-13: 12.5 mg via INTRAVENOUS
  Filled 2020-01-13: qty 1

## 2020-01-13 MED ORDER — FAMOTIDINE IN NACL 20-0.9 MG/50ML-% IV SOLN
20.0000 mg | INTRAVENOUS | Status: AC
  Administered 2020-01-13: 20 mg via INTRAVENOUS
  Filled 2020-01-13: qty 50

## 2020-01-13 MED ORDER — LACTATED RINGERS IV BOLUS
2000.0000 mL | Freq: Once | INTRAVENOUS | Status: AC
  Administered 2020-01-13: 1000 mL via INTRAVENOUS

## 2020-01-13 MED ORDER — SODIUM CHLORIDE 0.9 % IV BOLUS
1000.0000 mL | Freq: Once | INTRAVENOUS | Status: AC
  Administered 2020-01-13: 1000 mL via INTRAVENOUS

## 2020-01-13 MED ORDER — METOCLOPRAMIDE HCL 5 MG/ML IJ SOLN
10.0000 mg | INTRAMUSCULAR | Status: AC
  Administered 2020-01-13: 10 mg via INTRAVENOUS
  Filled 2020-01-13: qty 2

## 2020-01-13 MED ORDER — ONDANSETRON 4 MG PO TBDP
4.0000 mg | ORAL_TABLET | Freq: Once | ORAL | Status: AC
  Administered 2020-01-13: 4 mg via ORAL
  Filled 2020-01-13: qty 1

## 2020-01-13 MED ORDER — ONDANSETRON HCL 4 MG PO TABS: 4.0000 mg | ORAL_TABLET | Freq: Three times a day (TID) | ORAL | 0 refills | Status: DC | PRN

## 2020-01-13 MED ORDER — FENTANYL CITRATE (PF) 100 MCG/2ML IJ SOLN
50.0000 ug | Freq: Once | INTRAMUSCULAR | Status: AC
  Administered 2020-01-13: 50 ug via INTRAVENOUS
  Filled 2020-01-13: qty 2

## 2020-01-13 NOTE — ED Notes (Signed)
1st liter of lactate ringers infused one more to go

## 2020-01-13 NOTE — ED Notes (Signed)
No longer vomiting sleeping for the last 2 hours

## 2020-01-13 NOTE — ED Triage Notes (Signed)
Pt arrived by [facility] from home  The pts family has been passing arounf a gi bug in the past several days.  Tonight the pt developed nausea vomiting and diarrhea since 67mn some dizziness when she  Went to the br earlier ems started an iv and gave the pt fluids 533ml and 4mg  of zofran on the way here.   A and o x4  lmp hysterectomy

## 2020-01-13 NOTE — ED Notes (Signed)
Patient Alert and oriented to baseline. Stable and ambulatory to baseline. Patient verbalized understanding of the discharge instructions.  Patient belongings were taken by the patient.   

## 2020-01-13 NOTE — ED Notes (Signed)
Attempted orthostatic VS, pt actively N/V.

## 2020-01-13 NOTE — ED Notes (Signed)
Pt c/o nausea still  Ems reportts that at home the pts initial bp was in the 70s but after   nss she was given  Brought her bp up to 126 70

## 2020-01-13 NOTE — ED Notes (Signed)
Pt provided with ice water

## 2020-01-13 NOTE — ED Provider Notes (Signed)
[facility] EMERGENCY DEPARTMENT Provider Note   CSN: [id number] Arrival date & time: 01/13/20  [id number]     History Chief Complaint  Patient presents with  . Abdominal Pain    April Sutton is a 44 y.o. female.  44 year old female presents to the emergency department for evaluation of nausea, vomiting, diarrhea.  Vomiting began around midnight tonight.  She has had too numerous to count episodes of emesis with persistent, watery diarrhea.  Did have a near syncopal event while vomiting at home.  Complained of lightheadedness with some vision changes.  Never completely lost consciousness.  She is having lower abdominal cramping.  No medications taken prior to calling EMS.  She was given 4 mg IV Zofran and IV fluids in route to the ED.  Patient states that there has been a stomach bug going around her family over the past several days.  Abdominal surgical history significant for hysterectomy.  The history is provided by the patient. No language interpreter was used.       Past Medical History:  Diagnosis Date  . Dysmenorrhea   . Endometriosis   . Family history of adverse reaction to anesthesia    paternal grandmother--- hard to wake  . Heavy menstrual period   . Migraine   . PONV (postoperative nausea and vomiting)   . Prolapse of female pelvic organs   . Wears contact lenses   . Wears hearing aid    hearing aid right ear only    Patient Active Problem List   Diagnosis Date Noted  . S/P hysterectomy 11/09/2016  . Endometriosis 06/24/2011  . Vulvar irritation 06/24/2011  . Pelvic pain in female 04/28/2011  . Urinary incontinence 04/28/2011    Past Surgical History:  Procedure Laterality Date  . CYSTOCELE REPAIR N/A 11/09/2016   Procedure: ANTERIOR REPAIR (CYSTOCELE);  Surgeon: Linda Hedges, DO;  Location: [facility];  Service: Gynecology;  Laterality: N/A;  . DX LAPAROSCOPY W/ LASER ABLATION ENDOMETRIOSIS AND UTEROSACRAL NERVE  03-08-2003   dr Gus Height  . LAPAROSCOPIC VAGINAL HYSTERECTOMY WITH SALPINGECTOMY Bilateral 11/09/2016   Procedure: LAPAROSCOPIC ASSISTED VAGINAL HYSTERECTOMY WITH BILATERAL SALPINGO-OOPHERECTOMY, Anterior Rpr.;  Surgeon: Linda Hedges, DO;  Location: [facility];  Service: Gynecology;  Laterality: Bilateral;     OB History    Gravida  3   Para  3   Term      Preterm      AB      Living  3     SAB      TAB      Ectopic      Multiple      Live Births  3           Family History  Problem Relation Age of Onset  . Heart attack Father   . Heart disease Father   . Mental illness Mother   . Diabetes Maternal Grandmother     Social History   Tobacco Use  . Smoking status: Never Smoker  . Smokeless tobacco: Never Used  Substance Use Topics  . Alcohol use: No  . Drug use: No    Home Medications Prior to Admission medications   Medication Sig Start Date End Date Taking? Authorizing Provider  aspirin-acetaminophen-caffeine (EXCEDRIN MIGRAINE) [phone] MG tablet Take 1-2 tablets by mouth every 6 (six) hours as needed for headache or migraine.    Yes [provider]  BLACK ELDERBERRY PO Take 1 tablet by mouth daily.   Yes  [provider]  butalbital-acetaminophen-caffeine (FIORICET, ESGIC) 50-325-40 MG tablet Take 1 tablet by mouth 2 (two) times daily as needed for headache or migraine.    Yes [provider]  cetirizine (ZYRTEC) 10 MG tablet Take 10 mg by mouth daily.   Yes [provider]  Multiple Vitamin (MULTIVITAMIN) tablet Take 1 tablet by mouth daily.   Yes [provider]  ibuprofen (ADVIL,MOTRIN) 800 MG tablet Take 1 tablet (800 mg total) by mouth every 6 (six) hours as needed. Patient not taking: Reported on 01/13/2020 11/10/16   Linda Hedges, DO  ondansetron (ZOFRAN) 4 MG tablet Take 1 tablet (4 mg total) by mouth every 6 (six) hours. Patient not taking: Reported on [id number]   Delora Fuel, MD     Allergies    Imitrex [sumatriptan] and Latex  Review of Systems   Review of Systems  Ten systems reviewed and are negative for acute change, except as noted in the HPI.    Physical Exam Updated Vital Signs BP 139/83   Pulse (!) 108   Temp 97.9 F (36.6 C) (Oral)   Resp (!) 21   Ht 5\' 2"  (1.575 m)   Wt 99.3 kg   LMP 10/23/2016 (Approximate) Comment: w/ bilat oophorectomies//a.c.  SpO2 96%   BMI 40.04 kg/m   Physical Exam Vitals and nursing note reviewed.  Constitutional:      General: She is not in acute distress.    Appearance: She is well-developed. She is not diaphoretic.     Comments: Patient appears uncomfortable, nontoxic.  HENT:     Head: Normocephalic and atraumatic.  Eyes:     General: No scleral icterus.    Conjunctiva/sclera: Conjunctivae normal.  Cardiovascular:     Rate and Rhythm: Normal rate and regular rhythm.     Pulses: Normal pulses.  Pulmonary:     Effort: Pulmonary effort is normal. No respiratory distress.     Comments: Respirations even and unlabored Abdominal:     Comments: Abdomen soft, nondistended, obese.  No focal tenderness on exam.  No peritoneal signs.  Musculoskeletal:        General: Normal range of motion.     Cervical back: Normal range of motion.  Skin:    General: Skin is warm and dry.     Coloration: Skin is not pale.     Findings: No erythema or rash.  Neurological:     Mental Status: She is alert and oriented to person, place, and time.  Psychiatric:        Behavior: Behavior normal.     ED Results / Procedures / Treatments   Labs (all labs ordered are listed, but only abnormal results are displayed) Labs Reviewed  CBC WITH DIFFERENTIAL/PLATELET - Abnormal; Notable for the following components:      Result Value   WBC 19.7 (*)    Neutro Abs 17.4 (*)    Abs Immature Granulocytes 0.09 (*)    All other components within normal limits  COMPREHENSIVE METABOLIC PANEL - Abnormal; Notable for the following  components:   Glucose, Bld 107 (*)    All other components within normal limits  LIPASE, BLOOD  I-STAT BETA HCG BLOOD, ED ([facility], [facility], [facility] ONLY)    EKG EKG Interpretation  Date/Time:  Saturday January 13 2020 03:17:22 EDT Ventricular Rate:  91 PR Interval:    QRS Duration: 85 QT Interval:  341 QTC Calculation: 420 R Axis:     Text Interpretation: Sinus rhythm Borderline T wave abnormalities  Confirmed by Veryl Speak [phone]) on 01/13/2020 3:20:53 AM   Radiology No results found.  Procedures Procedures (including critical care time)  Medications Ordered in ED Medications  lactated ringers bolus 2,000 mL (0 mLs Intravenous Stopped 01/13/20 0614)  metoCLOPramide (REGLAN) injection 10 mg (10 mg Intravenous Given 01/13/20 0410)  famotidine (PEPCID) IVPB 20 mg premix (0 mg Intravenous Stopped 01/13/20 0439)  sodium chloride 0.9 % bolus 1,000 mL (1,000 mLs Intravenous New Bag/Given 01/13/20 0616)  fentaNYL (SUBLIMAZE) injection 50 mcg (50 mcg Intravenous Given 01/13/20 0522)  promethazine (PHENERGAN) injection 12.5 mg (12.5 mg Intravenous Given 01/13/20 0522)    ED Course  I have reviewed the triage vital signs and the nursing notes.  Pertinent labs & imaging results that were available during my care of the patient were reviewed by me and considered in my medical decision making (see chart for details).  Clinical Course as of Jan 13 623  Sat Jan 13, 2020  0510 Patient reassessed.  States that her nausea has persisted despite Reglan.  She is complaining of ongoing abdominal cramping.  Will give IV Phenergan and dose of fentanyl.  Labs reviewed without electrolyte derangements.  Liver and kidney function preserved.  She has a leukocytosis of 19.7 which is nonspecific and likely attributed to dehydration and stress of persistent vomiting.   [KH]  [id number] Patient states that her pain and nausea have improved.  She is continuing to receive IV fluids.  Ice chips given for oral challenge.   [KH]     Clinical Course User Index [KH] Antonietta Breach, PA-C   MDM Rules/Calculators/A&P                      Patient with symptoms consistent with viral gastroenteritis.  Has been exposed to family members with similar symptoms.  Given multiple boluses of IV fluids for dehydration.  Also complaining of persistent nausea which has improved following Reglan and Phenergan.  She has a soft, nondistended abdomen.  There is no focal abdominal tenderness to suggest need for emergent imaging.  Labs generally reassuring.  Patient pending oral fluid challenge.  Feels she is appropriate for discharge with antiemetics if symptoms well controlled.   Final Clinical Impression(s) / ED Diagnoses Final diagnoses:  Gastroenteritis    Rx / DC Orders ED Discharge Orders    None       Antonietta Breach, PA-C 01/13/20 0630    Veryl Speak, MD 01/13/20 [phone]

## 2020-01-13 NOTE — ED Notes (Signed)
The pt has not stopped being nauseated  She is vomiting still a  Little at a time

## 2022-11-03 ENCOUNTER — Other Ambulatory Visit: Payer: Self-pay | Admitting: Physician Assistant

## 2022-11-03 DIAGNOSIS — R1013 Epigastric pain: Secondary | ICD-10-CM

## 2022-11-05 ENCOUNTER — Encounter (HOSPITAL_BASED_OUTPATIENT_CLINIC_OR_DEPARTMENT_OTHER): Payer: Self-pay | Admitting: Radiology

## 2022-11-05 ENCOUNTER — Other Ambulatory Visit: Payer: Self-pay

## 2022-11-05 ENCOUNTER — Emergency Department (HOSPITAL_BASED_OUTPATIENT_CLINIC_OR_DEPARTMENT_OTHER)
Admission: RE | Admit: 2022-11-05 | Discharge: 2022-11-05 | Disposition: A | Discharge status: Home / Self Care | Payer: 59 | Source: Ambulatory Visit | Attending: Physician Assistant | Admitting: Physician Assistant

## 2022-11-05 ENCOUNTER — Emergency Department (HOSPITAL_BASED_OUTPATIENT_CLINIC_OR_DEPARTMENT_OTHER)
Admission: EM | Admit: 2022-11-05 | Discharge: 2022-11-06 | Disposition: A | Discharge status: Home / Self Care | Payer: 59 | Attending: Emergency Medicine | Admitting: Emergency Medicine

## 2022-11-05 DIAGNOSIS — K828 Other specified diseases of gallbladder: Secondary | ICD-10-CM | POA: Insufficient documentation

## 2022-11-05 DIAGNOSIS — R197 Diarrhea, unspecified: Secondary | ICD-10-CM | POA: Diagnosis not present

## 2022-11-05 DIAGNOSIS — Z91040 Latex allergy status: Secondary | ICD-10-CM | POA: Diagnosis not present

## 2022-11-05 DIAGNOSIS — R1013 Epigastric pain: Secondary | ICD-10-CM | POA: Insufficient documentation

## 2022-11-05 DIAGNOSIS — K838 Other specified diseases of biliary tract: Secondary | ICD-10-CM

## 2022-11-05 DIAGNOSIS — Z7982 Long term (current) use of aspirin: Secondary | ICD-10-CM | POA: Insufficient documentation

## 2022-11-05 DIAGNOSIS — D72829 Elevated white blood cell count, unspecified: Secondary | ICD-10-CM | POA: Insufficient documentation

## 2022-11-05 DIAGNOSIS — R7401 Elevation of levels of liver transaminase levels: Secondary | ICD-10-CM | POA: Insufficient documentation

## 2022-11-05 DIAGNOSIS — R1011 Right upper quadrant pain: Secondary | ICD-10-CM

## 2022-11-05 LAB — CBC
HCT: 43.2 % (ref 36.0–46.0)
Hemoglobin: 14.3 g/dL (ref 12.0–15.0)
MCH: 29.5 pg (ref 26.0–34.0)
MCHC: 33.1 g/dL (ref 30.0–36.0)
MCV: 89.3 fL (ref 80.0–100.0)
Platelets: 329 10*3/uL (ref 150–400)
RBC: 4.84 MIL/uL (ref 3.87–5.11)
RDW: 12.5 % (ref 11.5–15.5)
WBC: 16.7 10*3/uL — ABNORMAL HIGH (ref 4.0–10.5)
nRBC: 0 % (ref 0.0–0.2)

## 2022-11-05 NOTE — ED Triage Notes (Signed)
Pt has had diarrhea and stomach pain for the past two weeks. Pt states her pain has moved to her back. Pt had a ultrasounds of her gallbladder done today here but hasn't gotten the results back. Pt was instructed to come to the ED if she had worsening pain.

## 2022-11-06 ENCOUNTER — Emergency Department (HOSPITAL_BASED_OUTPATIENT_CLINIC_OR_DEPARTMENT_OTHER): Payer: 59

## 2022-11-06 LAB — COMPREHENSIVE METABOLIC PANEL
ALT: 38 U/L (ref 0–44)
AST: 19 U/L (ref 15–41)
Albumin: 4 g/dL (ref 3.5–5.0)
Alkaline Phosphatase: 94 U/L (ref 38–126)
Anion gap: 8 (ref 5–15)
BUN: 12 mg/dL (ref 6–20)
CO2: 29 mmol/L (ref 22–32)
Calcium: 9.5 mg/dL (ref 8.9–10.3)
Chloride: 101 mmol/L (ref 98–111)
Creatinine, Ser: 0.84 mg/dL (ref 0.44–1.00)
GFR, Estimated: >60 mL/min (ref >60)
Glucose, Bld: 90 mg/dL (ref 70–99)
Potassium: 3.7 mmol/L (ref 3.5–5.1)
Sodium: 138 mmol/L (ref 135–145)
Total Bilirubin: 0.3 mg/dL (ref 0.3–1.2)
Total Protein: 7.1 g/dL (ref 6.5–8.1)

## 2022-11-06 LAB — LIPASE, BLOOD: Lipase: 17 U/L (ref 11–51)

## 2022-11-06 MED ORDER — SODIUM CHLORIDE 0.9 % IV BOLUS
1000.0000 mL | Freq: Once | INTRAVENOUS | Status: AC
  Administered 2022-11-06: 1000 mL via INTRAVENOUS

## 2022-11-06 MED ORDER — ONDANSETRON 8 MG PO TBDP: 8.0000 mg | ORAL_TABLET | Freq: Three times a day (TID) | ORAL | 0 refills | Status: AC | PRN

## 2022-11-06 MED ORDER — OXYCODONE HCL 5 MG PO TABS: 5.0000 mg | ORAL_TABLET | ORAL | 0 refills | Status: DC | PRN

## 2022-11-06 MED ORDER — IOHEXOL 300 MG/ML  SOLN
100.0000 mL | Freq: Once | INTRAMUSCULAR | Status: AC | PRN
  Administered 2022-11-06: 100 mL via INTRAVENOUS

## 2022-11-06 MED ORDER — ONDANSETRON HCL 4 MG/2ML IJ SOLN
4.0000 mg | Freq: Once | INTRAMUSCULAR | Status: AC
  Administered 2022-11-06: 4 mg via INTRAVENOUS
  Filled 2022-11-06: qty 2

## 2022-11-06 MED ORDER — MORPHINE SULFATE (PF) 4 MG/ML IV SOLN
4.0000 mg | Freq: Once | INTRAVENOUS | Status: AC
  Administered 2022-11-06: 4 mg via INTRAVENOUS
  Filled 2022-11-06: qty 1

## 2022-11-06 NOTE — Discharge Instructions (Addendum)
Your ultrasound yesterday showed sludge in the gallbladder.  That can cause pain similar to what you are having, but is not necessarily what is causing pain.  Your CT scan did not show any serious problem in the abdomen.  Please call your gastroenterologist later today to decide what the next diagnostic step would be.  Your gastroenterologist may want to do an endoscopy, or may want to do a nuclear medicine test called a HIDA scan.  You may take over-the-counter analgesics as needed, use oxycodone for more severe pain.  At any point if pain is not being controlled adequately at home, you are welcome to return to the emergency department.

## 2022-11-06 NOTE — ED Notes (Signed)
Dr. Roxanne Mins at bedside at this time

## 2022-11-06 NOTE — ED Provider Notes (Signed)
[facility] Provider Note   CSN: [id number] Arrival date & time: 11/05/22  2325     History  Chief Complaint  Patient presents with   Abdominal Pain    April Sutton is a 47 y.o. female.  The history is provided by the patient.  Abdominal Pain She has history of endometriosis, migraines and has been having upper abdominal pain radiating to the back for the last 2 weeks.  This has been associated with diarrhea.  There has been no nausea or vomiting and she denies fever or chills.  She saw her gastroenterologist who started her on pantoprazole and something to coat her stomach, but it has not been helping.  She did have an ultrasound of her abdomen done earlier today.  Lab work at her gastroenterologist's office is reported to have shown slightly elevated ALT of 66.  She comes in tonight because her pain is getting worse.   Home Medications Prior to Admission medications   Medication Sig Start Date End Date Taking? Authorizing Provider  aspirin-acetaminophen-caffeine (EXCEDRIN MIGRAINE) [phone] MG tablet Take 1-2 tablets by mouth every 6 (six) hours as needed for headache or migraine.     [provider]  BLACK ELDERBERRY PO Take 1 tablet by mouth daily.    [provider]  butalbital-acetaminophen-caffeine (FIORICET, ESGIC) 50-325-40 MG tablet Take 1 tablet by mouth 2 (two) times daily as needed for headache or migraine.     [provider]  cetirizine (ZYRTEC) 10 MG tablet Take 10 mg by mouth daily.    [provider]  ibuprofen (ADVIL,MOTRIN) 800 MG tablet Take 1 tablet (800 mg total) by mouth every 6 (six) hours as needed. Patient not taking: Reported on 01/13/2020 11/10/16   Linda Hedges, DO  Multiple Vitamin (MULTIVITAMIN) tablet Take 1 tablet by mouth daily.    [provider]  ondansetron (ZOFRAN) 4 MG tablet Take 1 tablet (4 mg total) by mouth every 8 (eight) hours as needed for nausea or  vomiting. 01/13/20   Domenic Moras, PA-C      Allergies    Imitrex [sumatriptan] and Latex    Review of Systems   Review of Systems  Gastrointestinal:  Positive for abdominal pain.  All other systems reviewed and are negative.   Physical Exam Updated Vital Signs BP (!) 145/83 (BP Location: Right Arm)   Pulse 73   Temp 98.4 F (36.9 C)   Resp 16   Ht '5\' 2"'$  (1.575 m)   Wt 93.9 kg   LMP 10/23/2016 (Approximate) Comment: w/ bilat oophorectomies//a.c.  SpO2 100%   BMI 37.86 kg/m  Physical Exam Vitals and nursing note reviewed.   47 year old female, resting comfortably and in no acute distress. Vital signs are significant for borderline elevated blood pressure. Oxygen saturation is 100%, which is normal. Head is normocephalic and atraumatic. PERRLA, EOMI. Oropharynx is clear. Neck is nontender and supple without adenopathy or JVD. Back is nontender and there is no CVA tenderness. Lungs are clear without rales, wheezes, or rhonchi. Chest is nontender. Heart has regular rate and rhythm without murmur. Abdomen is soft, flat, with moderate epigastric tenderness.  There is negative Murphy sign.  There is no rebound or guarding.  Peristalsis is slightly hyperactive. Extremities have no cyanosis or edema, full range of motion is present. Skin is warm and dry without rash. Neurologic: Mental status is normal, cranial nerves are intact, moves all extremities equally.  ED Results / Procedures / Treatments  Labs (all labs ordered are listed, but only abnormal results are displayed) Labs Reviewed  CBC - Abnormal; Notable for the following components:      Result Value   WBC 16.7 (*)    All other components within normal limits  LIPASE, BLOOD  COMPREHENSIVE METABOLIC PANEL  URINALYSIS, ROUTINE W REFLEX MICROSCOPIC   Radiology CT ABDOMEN PELVIS W CONTRAST  Result Date: 11/06/2022 CLINICAL DATA:  Epigastric pain. EXAM: CT ABDOMEN AND PELVIS WITH CONTRAST TECHNIQUE: Multidetector CT  imaging of the abdomen and pelvis was performed using the standard protocol following bolus administration of intravenous contrast. RADIATION DOSE REDUCTION: This exam was performed according to the departmental dose-optimization program which includes automated exposure control, adjustment of the mA and/or kV according to patient size and/or use of iterative reconstruction technique. CONTRAST:  136m OMNIPAQUE IOHEXOL 300 MG/ML  SOLN COMPARISON:  CT abdomen pelvis dated 07/02/2017. FINDINGS: Lower chest: The visualized lung bases are clear. No intra-abdominal free air or free fluid. Hepatobiliary: The liver is unremarkable. No biliary dilatation. No calcified gallstone or pericholecystic fluid. Pancreas: Unremarkable. No pancreatic ductal dilatation or surrounding inflammatory changes. Spleen: Normal in size without focal abnormality. Adrenals/Urinary Tract: The adrenal glands are unremarkable. There is no hydronephrosis on either side. The visualized ureters appear unremarkable. The urinary bladder is collapsed. Stomach/Bowel: There is no bowel obstruction or active inflammation. The appendix is normal. Vascular/Lymphatic: Mild atherosclerotic calcification of the abdominal aorta. The IVC is unremarkable. No portal venous gas. There is no adenopathy. Reproductive: Hysterectomy.  No adnexal masses. Other: None Musculoskeletal: No acute or significant osseous findings. IMPRESSION: 1. No acute intra-abdominal or pelvic pathology. 2. No bowel obstruction. Normal appendix. 3.  Aortic Atherosclerosis (ICD10-I70.0). Electronically Signed   By: AAnner CreteM.D.   On: 11/06/2022 01:58   [place]oreaAbdomen Complete  Result Date: 11/05/2022 CLINICAL DATA:  Epigastric pain for 2 weeks. EXAM: ABDOMEN ULTRASOUND COMPLETE COMPARISON:  January 27, 2012 FINDINGS: Gallbladder: Sludge is noted in the gallbladder. No wall thickening visualized. No sonographic Murphy sign noted by sonographer. Common bile duct: Diameter: 3.9 mm Liver:  No focal lesion identified. Within normal limits in parenchymal echogenicity. Portal vein is patent on color Doppler imaging with normal direction of blood flow towards the liver. IVC: No abnormality visualized. Pancreas: Visualized portion unremarkable. Spleen: Size and appearance within normal limits. Right Kidney: Length: 11.3 cm. Echogenicity within normal limits. No mass or hydronephrosis visualized. Left Kidney: Length: 11.5 cm. Echogenicity within normal limits. No mass or hydronephrosis visualized. Abdominal aorta: No aneurysm visualized. Other findings: None. IMPRESSION: 1. Sludge is noted in the gallbladder. No sonographic evidence of acute cholecystitis. 2. No acute abnormality identified. Electronically Signed   By: WAbelardo DieselM.D.   On: 11/05/2022 09:04    Procedures Procedures    Medications Ordered in ED Medications  morphine (PF) 4 MG/ML injection 4 mg (has no administration in time range)  sodium chloride 0.9 % bolus 1,000 mL (0 mLs Intravenous Stopped 11/06/22 0234)  morphine (PF) 4 MG/ML injection 4 mg (4 mg Intravenous Given 11/06/22 0112)  ondansetron (ZOFRAN) injection 4 mg (4 mg Intravenous Given 11/06/22 0118)  iohexol (OMNIPAQUE) 300 MG/ML solution 100 mL (100 mLs Intravenous Contrast Given 11/06/22 0139)    ED Course/ Medical Decision Making/ A&P                             Medical Decision Making Amount and/or Complexity of Data Reviewed Labs: ordered. Radiology:  ordered.  Risk Prescription drug management.   Epigastric pain with radiation to the back and associated diarrhea.  Differential diagnosis includes, but is not limited to, peptic ulcer disease, GERD, pancreatitis, cholecystitis, diverticulitis, abdominal aortic aneurysm.  I have reviewed and interpreted her laboratory tests, and my interpretation is leukocytosis, but she has been noted to have elevated white blood cell count and prior hospital encounters.  Comprehensive metabolic panel and lipase are  completely normal including ALT level.  I have reviewed the report of her ultrasound from this morning and it showed sludge but no gallbladder wall thickening and no sonographic Murphy sign.  CT of abdomen and pelvis on 07/02/2017 showed no evidence of abdominal aneurysm.  In light of leukocytosis and inability to control pain, I have ordered a CT of abdomen and pelvis.  I have also ordered IV fluids and IV morphine.  She had good relief of pain with above-noted treatment, but pain is starting to recur.  CT of abdomen and pelvis showed presence of aortic atherosclerosis but no acute process.  Have independently viewed the images, and agree with the radiologist's interpretation.  I suspect biliary sludge is the cause of her pain, and she is having biliary colic.  However, that is not 100% clear.  I have ordered another dose of morphine and I am discharging her with prescription for ondansetron for nausea and a small number of oxycodone tablets for pain.  I have advised her to contact her gastroenterologist to decide whether to proceed with upper endoscopy or HIDA scan.  Return precautions discussed.  Final Clinical Impression(s) / ED Diagnoses Final diagnoses:  RUQ abdominal pain  Biliary sludge    Rx / DC Orders ED Discharge Orders          Ordered    ondansetron (ZOFRAN-ODT) 8 MG disintegrating tablet  Every 8 hours PRN        11/06/22 0336    oxyCODONE (ROXICODONE) 5 MG immediate release tablet  Every 4 hours PRN        11/06/22 1540              Delora Fuel, MD 08/67/61 [phone]

## 2022-11-06 NOTE — ED Notes (Signed)
Pt OTF to CT dept (late entry)

## 2022-11-06 NOTE — ED Notes (Signed)
Pt has returned from restroom -- now back in bed sitting up.  Reports uncomfortable lying down at this time.  Pt only able to provide scant amount of urine - states it is difficult due to "having a lot of diarrhea".  Pt provided with fresh urine cup to use with next void.  Continues to await ED provider.  Will maintain plan of care and monitor for acute changes and plan of care.

## 2022-11-06 NOTE — ED Notes (Addendum)
Pt agreeable with d/c plan as discussed by provider- this nurse has verbally reinforced d/c instructions and provided pt with written copy.  Pt acknowledges verbal understanding and denies any addl questions, concerns, needs- pt assisted to vehicle via w/c by this nurse; spouse is driver.  VSS at d/c; no acute/worsening changes or distress noted.

## 2022-11-06 NOTE — ED Notes (Addendum)
Pt oob to hall bathroom at this time- gait steady; provided urine cup for urine sample ordered - instructed pt on clean catch process.

## 2022-11-10 ENCOUNTER — Encounter (HOSPITAL_COMMUNITY): Payer: Self-pay

## 2022-11-10 ENCOUNTER — Other Ambulatory Visit (HOSPITAL_COMMUNITY): Payer: 59

## 2022-11-18 ENCOUNTER — Ambulatory Visit: Payer: Self-pay | Admitting: General Surgery

## 2022-11-18 DIAGNOSIS — K828 Other specified diseases of gallbladder: Secondary | ICD-10-CM

## 2022-11-18 DIAGNOSIS — D72829 Elevated white blood cell count, unspecified: Secondary | ICD-10-CM

## 2022-11-18 MED ORDER — SPY AGENT GREEN - (INDOCYANINE FOR INJECTION): 1.2500 mg | Freq: Once | INTRAMUSCULAR | Status: AC

## 2022-11-19 NOTE — Patient Instructions (Addendum)
SURGICAL WAITING ROOM VISITATION Patients having surgery or a procedure may have no more than 2 support people in the waiting area - these visitors may rotate.    If the patient needs to stay at the hospital during part of their recovery, the visitor guidelines for inpatient rooms apply. Pre-op nurse will coordinate an appropriate time for 1 support person to accompany patient in pre-op.  This support person may not rotate.    Please refer to the [facility] website for the visitor guidelines for Inpatients (after your surgery is over and you are in a regular room).   Due to an increase in RSV and influenza rates and associated hospitalizations, children ages 17 and under may not visit patients in [place].     Your procedure is scheduled on: 11-30-22   Report to [facility] Main Entrance    Report to admitting at 8:45 AM   Call this number if you have problems the morning of surgery [phone]   Do not eat food :After Midnight.   After Midnight you may have the following liquids until 8:00 AM DAY OF SURGERY  Water Non-Citrus Juices (without pulp, NO RED) Carbonated Beverages Black Coffee (NO MILK/CREAM OR CREAMERS, sugar ok)  Clear Tea (NO MILK/CREAM OR CREAMERS, sugar ok) regular and decaf                             Plain Jell-O (NO RED)                                           Fruit ices (not with fruit pulp, NO RED)                                     Popsicles (NO RED)                                                               Sports drinks like Gatorade (NO RED)                     If you have questions, please contact your surgeon's office.   FOLLOW ANY ADDITIONAL PRE OP INSTRUCTIONS YOU RECEIVED FROM YOUR SURGEON'S OFFICE!!!     Oral Hygiene is also important to reduce your risk of infection.                                    Remember - BRUSH YOUR TEETH THE MORNING OF SURGERY WITH YOUR REGULAR TOOTHPASTE   Do NOT smoke after  Midnight  Take these medicines the morning of surgery with A SIP OF WATER:   Pantoprazole  Ondansetron if needed                              You may not have any metal on your body including hair pins, jewelry, and body piercing  Do not wear make-up, lotions, powders, perfumes or deodorant  Do not wear nail polish including gel and S&S, artificial/acrylic nails, or any other type of covering on natural nails including finger and toenails. If you have artificial nails, gel coating, etc. that needs to be removed by a nail salon please have this removed prior to surgery or surgery may need to be canceled/ delayed if the surgeon/ anesthesia feels like they are unable to be safely monitored.   Do not shave  48 hours prior to surgery.        Do not bring valuables to the hospital. [place].   Contacts, dentures or bridgework may not be worn into surgery.  DO NOT [place]. PHARMACY WILL DISPENSE MEDICATIONS LISTED ON YOUR MEDICATION LIST TO YOU DURING YOUR ADMISSION [place]!    Patients discharged on the day of surgery will not be allowed to drive home.  Someone NEEDS to stay with you for the first 24 hours after anesthesia.              Please read over the following fact sheets you were given: IF [place]  If you received a COVID test during your pre-op visit  it is requested that you wear a mask when out in public, stay away from anyone that may not be feeling well and notify your surgeon if you develop symptoms. If you test positive for Covid or have been in contact with anyone that has tested positive in the last 10 days please notify you surgeon.  [place] - Preparing for Surgery Before surgery, you can play an important role.  Because skin is not sterile, your skin needs to be as free of germs as possible.  You can reduce  the number of germs on your skin by washing with CHG (chlorahexidine gluconate) soap before surgery.  CHG is an antiseptic cleaner which kills germs and bonds with the skin to continue killing germs even after washing. Please DO NOT use if you have an allergy to CHG or antibacterial soaps.  If your skin becomes reddened/irritated stop using the CHG and inform your nurse when you arrive at Short Stay. Do not shave (including legs and underarms) for at least 48 hours prior to the first CHG shower.  You may shave your face/neck.  Please follow these instructions carefully:  1.  Shower with CHG Soap the night before surgery and the  morning of surgery.  2.  If you choose to wash your hair, wash your hair first as usual with your normal  shampoo.  3.  After you shampoo, rinse your hair and body thoroughly to remove the shampoo.                             4.  Use CHG as you would any other liquid soap.  You can apply chg directly to the skin and wash.  Gently with a scrungie or clean washcloth.  5.  Apply the CHG Soap to your body ONLY FROM THE NECK DOWN.   Do   not use on face/ open                           Wound or open sores. Avoid contact with eyes, ears mouth and  genitals (private parts).                       Wash face,  Genitals (private parts) with your normal soap.             6.  Wash thoroughly, paying special attention to the area where your    surgery  will be performed.  7.  Thoroughly rinse your body with warm water from the neck down.  8.  DO NOT shower/wash with your normal soap after using and rinsing off the CHG Soap.                9.  Pat yourself dry with a clean towel.            10.  Wear clean pajamas.            11.  Place clean sheets on your bed the night of your first shower and do not  sleep with pets. Day of Surgery : Do not apply any lotions/deodorants the morning of surgery.  Please wear clean clothes to the hospital/surgery center.  FAILURE TO FOLLOW THESE  INSTRUCTIONS MAY RESULT IN THE CANCELLATION OF YOUR SURGERY  PATIENT SIGNATURE_________________________________  NURSE SIGNATURE__________________________________  ________________________________________________________________________

## 2022-11-19 NOTE — Progress Notes (Addendum)
COVID Vaccine Completed:  Yes  Date of COVID positive in last 90 days:  No  PCP - Donald Prose, MD Cardiologist - N/A  Chest x-ray -  N/A EKG -  N/A Stress Test -  N/A ECHO -  N/A Cardiac Cath -  N/A Pacemaker/ICD device last checked: Spinal Cord Stimulator: N/A  Bowel Prep -  N/A  Sleep Study -  N/A CPAP -   Fasting Blood Sugar -  N/A Checks Blood Sugar _____ times a day  Last dose of GLP1 agonist-  N/A GLP1 instructions:  N/A   Last dose of SGLT-2 inhibitors-  N/A SGLT-2 instructions: N/A  Blood Thinner Instructions: N/A Aspirin Instructions: Last Dose:  Activity level:  Can go up a flight of stairs and perform activities of daily living without stopping and without symptoms of chest pain or shortness of breath. Able to exercise without symptoms  Anesthesia review:  N/A  Patient denies shortness of breath, fever, cough and chest pain at PAT appointment  Patient verbalized understanding of instructions that were given to them at the PAT appointment. Patient was also instructed that they will need to review over the PAT instructions again at home before surgery.

## 2022-11-20 ENCOUNTER — Other Ambulatory Visit: Payer: Self-pay

## 2022-11-20 ENCOUNTER — Encounter (HOSPITAL_COMMUNITY): Payer: Self-pay

## 2022-11-20 ENCOUNTER — Encounter (HOSPITAL_COMMUNITY)
Admission: RE | Admit: 2022-11-20 | Discharge: 2022-11-20 | Disposition: A | Discharge status: Home / Self Care | Payer: 59 | Source: Ambulatory Visit | Attending: General Surgery | Admitting: General Surgery

## 2022-11-20 DIAGNOSIS — D72829 Elevated white blood cell count, unspecified: Secondary | ICD-10-CM | POA: Insufficient documentation

## 2022-11-20 DIAGNOSIS — Z01812 Encounter for preprocedural laboratory examination: Secondary | ICD-10-CM | POA: Insufficient documentation

## 2022-11-20 DIAGNOSIS — K828 Other specified diseases of gallbladder: Secondary | ICD-10-CM | POA: Diagnosis not present

## 2022-11-20 HISTORY — DX: Anemia, unspecified: D64.9

## 2022-11-20 LAB — CBC WITH DIFFERENTIAL/PLATELET
Abs Immature Granulocytes: 0.01 10*3/uL (ref 0.00–0.07)
Basophils Absolute: 0.1 10*3/uL (ref 0.0–0.1)
Basophils Relative: 1 %
Eosinophils Absolute: 0.7 10*3/uL — ABNORMAL HIGH (ref 0.0–0.5)
Eosinophils Relative: 10 %
HCT: 42.5 % (ref 36.0–46.0)
Hemoglobin: 13.7 g/dL (ref 12.0–15.0)
Immature Granulocytes: 0 %
Lymphocytes Relative: 37 %
Lymphs Abs: 2.4 10*3/uL (ref 0.7–4.0)
MCH: 29.8 pg (ref 26.0–34.0)
MCHC: 32.2 g/dL (ref 30.0–36.0)
MCV: 92.4 fL (ref 80.0–100.0)
Monocytes Absolute: 0.6 10*3/uL (ref 0.1–1.0)
Monocytes Relative: 9 %
Neutro Abs: 2.8 10*3/uL (ref 1.7–7.7)
Neutrophils Relative %: 43 %
Platelets: 316 10*3/uL (ref 150–400)
RBC: 4.6 MIL/uL (ref 3.87–5.11)
RDW: 12.6 % (ref 11.5–15.5)
WBC: 6.4 10*3/uL (ref 4.0–10.5)
nRBC: 0 % (ref 0.0–0.2)

## 2022-11-30 ENCOUNTER — Ambulatory Visit (HOSPITAL_COMMUNITY): Payer: 59 | Admitting: Certified Registered Nurse Anesthetist

## 2022-11-30 ENCOUNTER — Ambulatory Visit (HOSPITAL_COMMUNITY)
Admission: RE | Admit: 2022-11-30 | Discharge: 2022-11-30 | Disposition: A | Discharge status: Home / Self Care | Payer: 59 | Source: Ambulatory Visit | Attending: General Surgery | Admitting: General Surgery

## 2022-11-30 ENCOUNTER — Ambulatory Visit (HOSPITAL_BASED_OUTPATIENT_CLINIC_OR_DEPARTMENT_OTHER): Payer: 59 | Admitting: Certified Registered Nurse Anesthetist

## 2022-11-30 ENCOUNTER — Encounter (HOSPITAL_COMMUNITY): Payer: Self-pay | Admitting: General Surgery

## 2022-11-30 ENCOUNTER — Other Ambulatory Visit: Payer: Self-pay

## 2022-11-30 ENCOUNTER — Encounter (HOSPITAL_COMMUNITY)
Admission: RE | Disposition: A | Discharge status: Home / Self Care | Payer: Self-pay | Source: Ambulatory Visit | Attending: General Surgery

## 2022-11-30 DIAGNOSIS — K811 Chronic cholecystitis: Secondary | ICD-10-CM | POA: Diagnosis not present

## 2022-11-30 DIAGNOSIS — K219 Gastro-esophageal reflux disease without esophagitis: Secondary | ICD-10-CM | POA: Diagnosis not present

## 2022-11-30 DIAGNOSIS — K449 Diaphragmatic hernia without obstruction or gangrene: Secondary | ICD-10-CM | POA: Insufficient documentation

## 2022-11-30 DIAGNOSIS — K828 Other specified diseases of gallbladder: Secondary | ICD-10-CM

## 2022-11-30 DIAGNOSIS — Z79899 Other long term (current) drug therapy: Secondary | ICD-10-CM | POA: Diagnosis not present

## 2022-11-30 DIAGNOSIS — D72829 Elevated white blood cell count, unspecified: Secondary | ICD-10-CM

## 2022-11-30 HISTORY — PX: CHOLECYSTECTOMY: SHX55

## 2022-11-30 LAB — COMPREHENSIVE METABOLIC PANEL
ALT: 25 U/L (ref 0–44)
AST: 46 U/L — ABNORMAL HIGH (ref 15–41)
Albumin: 3.6 g/dL (ref 3.5–5.0)
Alkaline Phosphatase: 65 U/L (ref 38–126)
Anion gap: 10 (ref 5–15)
BUN: 14 mg/dL (ref 6–20)
CO2: 25 mmol/L (ref 22–32)
Calcium: 9.1 mg/dL (ref 8.9–10.3)
Chloride: 104 mmol/L (ref 98–111)
Creatinine, Ser: 0.93 mg/dL (ref 0.44–1.00)
GFR, Estimated: >60 mL/min (ref >60)
Glucose, Bld: 104 mg/dL — ABNORMAL HIGH (ref 70–99)
Potassium: 4.5 mmol/L (ref 3.5–5.1)
Sodium: 139 mmol/L (ref 135–145)
Total Bilirubin: 0.4 mg/dL (ref 0.3–1.2)
Total Protein: 6.9 g/dL (ref 6.5–8.1)

## 2022-11-30 SURGERY — LAPAROSCOPIC CHOLECYSTECTOMY
Anesthesia: General

## 2022-11-30 MED ORDER — ONDANSETRON HCL 4 MG/2ML IJ SOLN
INTRAMUSCULAR | Status: DC | PRN
  Administered 2022-11-30: 4 mg via INTRAVENOUS

## 2022-11-30 MED ORDER — KETOROLAC TROMETHAMINE 30 MG/ML IJ SOLN
INTRAMUSCULAR | Status: DC | PRN
  Administered 2022-11-30: 15 mg via INTRAVENOUS

## 2022-11-30 MED ORDER — BUPIVACAINE HCL (PF) 0.25 % IJ SOLN
INTRAMUSCULAR | Status: AC
  Filled 2022-11-30: qty 30

## 2022-11-30 MED ORDER — AMISULPRIDE (ANTIEMETIC) 5 MG/2ML IV SOLN
10.0000 mg | Freq: Once | INTRAVENOUS | Status: AC | PRN
  Administered 2022-11-30: 10 mg via INTRAVENOUS

## 2022-11-30 MED ORDER — BUPIVACAINE HCL 0.25 % IJ SOLN
INTRAMUSCULAR | Status: DC | PRN
  Administered 2022-11-30: 30 mL

## 2022-11-30 MED ORDER — OXYCODONE HCL 5 MG PO TABS: 5.0000 mg | ORAL_TABLET | Freq: Once | ORAL | Status: DC | PRN

## 2022-11-30 MED ORDER — PROPOFOL 10 MG/ML IV BOLUS
INTRAVENOUS | Status: AC
  Filled 2022-11-30: qty 20

## 2022-11-30 MED ORDER — FENTANYL CITRATE (PF) 100 MCG/2ML IJ SOLN
INTRAMUSCULAR | Status: AC
  Filled 2022-11-30: qty 2

## 2022-11-30 MED ORDER — LIDOCAINE HCL (PF) 2 % IJ SOLN
INTRAMUSCULAR | Status: AC
  Filled 2022-11-30: qty 5

## 2022-11-30 MED ORDER — OXYCODONE HCL 5 MG/5ML PO SOLN: 5.0000 mg | Freq: Once | ORAL | Status: DC | PRN

## 2022-11-30 MED ORDER — PROMETHAZINE HCL 25 MG/ML IJ SOLN
INTRAMUSCULAR | Status: AC
  Filled 2022-11-30: qty 1

## 2022-11-30 MED ORDER — PROPOFOL 10 MG/ML IV BOLUS
INTRAVENOUS | Status: DC | PRN
  Administered 2022-11-30: 200 mg via INTRAVENOUS

## 2022-11-30 MED ORDER — DEXAMETHASONE SODIUM PHOSPHATE 10 MG/ML IJ SOLN
INTRAMUSCULAR | Status: AC
  Filled 2022-11-30: qty 1

## 2022-11-30 MED ORDER — PROPOFOL 500 MG/50ML IV EMUL
INTRAVENOUS | Status: DC | PRN
  Administered 2022-11-30: 175 ug/kg/min via INTRAVENOUS

## 2022-11-30 MED ORDER — LIDOCAINE 2% (20 MG/ML) 5 ML SYRINGE
INTRAMUSCULAR | Status: DC | PRN
  Administered 2022-11-30: 100 mg via INTRAVENOUS

## 2022-11-30 MED ORDER — LACTATED RINGERS IV SOLN: INTRAVENOUS | Status: DC

## 2022-11-30 MED ORDER — LACTATED RINGERS IR SOLN
Status: DC | PRN
  Administered 2022-11-30: 1000 mL

## 2022-11-30 MED ORDER — OXYCODONE HCL 5 MG PO TABS: 5.0000 mg | ORAL_TABLET | Freq: Four times a day (QID) | ORAL | 0 refills | Status: AC | PRN

## 2022-11-30 MED ORDER — SODIUM CHLORIDE 0.9 % IV SOLN
2.0000 g | INTRAVENOUS | Status: AC
  Administered 2022-11-30: 2 g via INTRAVENOUS
  Filled 2022-11-30: qty 2

## 2022-11-30 MED ORDER — ACETAMINOPHEN 500 MG PO TABS: 1000.0000 mg | ORAL_TABLET | Freq: Once | ORAL | Status: DC

## 2022-11-30 MED ORDER — ACETAMINOPHEN 500 MG PO TABS
1000.0000 mg | ORAL_TABLET | ORAL | Status: AC
  Administered 2022-11-30: 1000 mg via ORAL
  Filled 2022-11-30: qty 2

## 2022-11-30 MED ORDER — CHLORHEXIDINE GLUCONATE CLOTH 2 % EX PADS: 6.0000 | MEDICATED_PAD | Freq: Once | CUTANEOUS | Status: DC

## 2022-11-30 MED ORDER — FENTANYL CITRATE (PF) 100 MCG/2ML IJ SOLN
INTRAMUSCULAR | Status: DC | PRN
  Administered 2022-11-30: 100 ug via INTRAVENOUS
  Administered 2022-11-30: 50 ug via INTRAVENOUS
  Administered 2022-11-30: 25 ug via INTRAVENOUS
  Administered 2022-11-30: 50 ug via INTRAVENOUS

## 2022-11-30 MED ORDER — SUGAMMADEX SODIUM 200 MG/2ML IV SOLN
INTRAVENOUS | Status: DC | PRN
  Administered 2022-11-30: 200 mg via INTRAVENOUS

## 2022-11-30 MED ORDER — ACETAMINOPHEN 500 MG PO TABS: 1000.0000 mg | ORAL_TABLET | Freq: Three times a day (TID) | ORAL | 0 refills | Status: AC

## 2022-11-30 MED ORDER — PROPOFOL 500 MG/50ML IV EMUL
INTRAVENOUS | Status: AC
  Filled 2022-11-30: qty 50

## 2022-11-30 MED ORDER — FENTANYL CITRATE PF 50 MCG/ML IJ SOSY
PREFILLED_SYRINGE | INTRAMUSCULAR | Status: AC
  Filled 2022-11-30: qty 1

## 2022-11-30 MED ORDER — CHLORHEXIDINE GLUCONATE 0.12 % MT SOLN
15.0000 mL | Freq: Once | OROMUCOSAL | Status: AC
  Administered 2022-11-30: 15 mL via OROMUCOSAL

## 2022-11-30 MED ORDER — ORAL CARE MOUTH RINSE: 15.0000 mL | Freq: Once | OROMUCOSAL | Status: AC

## 2022-11-30 MED ORDER — MIDAZOLAM HCL 2 MG/2ML IJ SOLN
INTRAMUSCULAR | Status: AC
  Filled 2022-11-30: qty 2

## 2022-11-30 MED ORDER — DEXAMETHASONE SODIUM PHOSPHATE 10 MG/ML IJ SOLN
INTRAMUSCULAR | Status: DC | PRN
  Administered 2022-11-30: 5 mg via INTRAVENOUS

## 2022-11-30 MED ORDER — SCOPOLAMINE 1 MG/3DAYS TD PT72
1.0000 | MEDICATED_PATCH | Freq: Once | TRANSDERMAL | Status: DC
  Administered 2022-11-30: 1.5 mg via TRANSDERMAL
  Filled 2022-11-30: qty 1

## 2022-11-30 MED ORDER — PROMETHAZINE HCL 25 MG/ML IJ SOLN
6.2500 mg | Freq: Once | INTRAMUSCULAR | Status: AC
  Administered 2022-11-30: 6.25 mg via INTRAVENOUS

## 2022-11-30 MED ORDER — ROCURONIUM BROMIDE 10 MG/ML (PF) SYRINGE
PREFILLED_SYRINGE | INTRAVENOUS | Status: DC | PRN
  Administered 2022-11-30: 70 mg via INTRAVENOUS

## 2022-11-30 MED ORDER — FENTANYL CITRATE PF 50 MCG/ML IJ SOSY
25.0000 ug | PREFILLED_SYRINGE | INTRAMUSCULAR | Status: DC | PRN
  Administered 2022-11-30: 50 ug via INTRAVENOUS

## 2022-11-30 MED ORDER — 0.9 % SODIUM CHLORIDE (POUR BTL) OPTIME
TOPICAL | Status: DC | PRN
  Administered 2022-11-30: 1000 mL

## 2022-11-30 MED ORDER — INDOCYANINE GREEN 25 MG IV SOLR
1.2500 mg | Freq: Once | INTRAVENOUS | Status: AC
  Administered 2022-11-30: 1.25 mg via INTRAVENOUS

## 2022-11-30 MED ORDER — ROCURONIUM BROMIDE 10 MG/ML (PF) SYRINGE
PREFILLED_SYRINGE | INTRAVENOUS | Status: AC
  Filled 2022-11-30: qty 10

## 2022-11-30 MED ORDER — MIDAZOLAM HCL 5 MG/5ML IJ SOLN
INTRAMUSCULAR | Status: DC | PRN
  Administered 2022-11-30: 2 mg via INTRAVENOUS

## 2022-11-30 MED ORDER — PROPOFOL 1000 MG/100ML IV EMUL
INTRAVENOUS | Status: AC
  Filled 2022-11-30: qty 100

## 2022-11-30 MED ORDER — AMISULPRIDE (ANTIEMETIC) 5 MG/2ML IV SOLN
INTRAVENOUS | Status: AC
  Filled 2022-11-30: qty 4

## 2022-11-30 MED ORDER — ONDANSETRON HCL 4 MG/2ML IJ SOLN
INTRAMUSCULAR | Status: AC
  Filled 2022-11-30: qty 2

## 2022-11-30 SURGICAL SUPPLY — 48 items
APPLICATOR ARISTA FLEXITIP XL (MISCELLANEOUS) IMPLANT
APPLIER CLIP 5 13 M/L LIGAMAX5 (MISCELLANEOUS) ×1
APPLIER CLIP ROT 10 11.4 M/L (STAPLE)
BAG COUNTER SPONGE SURGICOUNT (BAG) IMPLANT
CABLE HIGH FREQUENCY MONO STRZ (ELECTRODE) ×1 IMPLANT
CHLORAPREP W/TINT 26 (MISCELLANEOUS) ×1 IMPLANT
CLIP APPLIE 5 13 M/L LIGAMAX5 (MISCELLANEOUS) IMPLANT
CLIP APPLIE ROT 10 11.4 M/L (STAPLE) IMPLANT
CLIP LIGATING HEMO O LOK GREEN (MISCELLANEOUS) IMPLANT
COVER MAYO STAND XLG (MISCELLANEOUS) IMPLANT
COVER SURGICAL LIGHT HANDLE (MISCELLANEOUS) ×1 IMPLANT
DERMABOND ADVANCED .7 DNX12 (GAUZE/BANDAGES/DRESSINGS) IMPLANT
DRAPE C-ARM 42X120 X-RAY (DRAPES) IMPLANT
DRSG TEGADERM 2-3/8X2-3/4 SM (GAUZE/BANDAGES/DRESSINGS) ×3 IMPLANT
DRSG TEGADERM 4X4.75 (GAUZE/BANDAGES/DRESSINGS) ×1 IMPLANT
ELECT REM PT RETURN 15FT ADLT (MISCELLANEOUS) ×1 IMPLANT
GAUZE SPONGE 2X2 STRL 8-PLY (GAUZE/BANDAGES/DRESSINGS) ×1 IMPLANT
GLOVE BIO SURGEON STRL SZ7.5 (GLOVE) ×1 IMPLANT
GLOVE INDICATOR 8.0 STRL GRN (GLOVE) ×1 IMPLANT
GOWN STRL REUS W/ TWL XL LVL3 (GOWN DISPOSABLE) ×1 IMPLANT
GOWN STRL REUS W/TWL XL LVL3 (GOWN DISPOSABLE) ×1
GRASPER SUT TROCAR 14GX15 (MISCELLANEOUS) IMPLANT
HEMOSTAT ARISTA ABSORB 3G PWDR (HEMOSTASIS) IMPLANT
HEMOSTAT SNOW SURGICEL 2X4 (HEMOSTASIS) IMPLANT
IRRIG SUCT STRYKERFLOW 2 WTIP (MISCELLANEOUS) ×1
IRRIGATION SUCT STRKRFLW 2 WTP (MISCELLANEOUS) ×1 IMPLANT
KIT BASIN OR (CUSTOM PROCEDURE TRAY) ×1 IMPLANT
KIT TURNOVER KIT A (KITS) IMPLANT
L-HOOK LAP DISP 36CM (ELECTROSURGICAL)
LHOOK LAP DISP 36CM (ELECTROSURGICAL) IMPLANT
POUCH RETRIEVAL ECOSAC 10 (ENDOMECHANICALS) ×1 IMPLANT
POUCH RETRIEVAL ECOSAC 10MM (ENDOMECHANICALS) ×1
SCISSORS LAP 5X35 DISP (ENDOMECHANICALS) ×1 IMPLANT
SET CHOLANGIOGRAPH MIX (MISCELLANEOUS) IMPLANT
SET TUBE SMOKE EVAC HIGH FLOW (TUBING) ×1 IMPLANT
SLEEVE Z-THREAD 5X100MM (TROCAR) ×2 IMPLANT
SPIKE FLUID TRANSFER (MISCELLANEOUS) ×1 IMPLANT
STRIP CLOSURE SKIN 1/2X4 (GAUZE/BANDAGES/DRESSINGS) ×1 IMPLANT
SUT MNCRL AB 4-0 PS2 18 (SUTURE) ×1 IMPLANT
SUT VIC AB 0 UR5 27 (SUTURE) IMPLANT
SUT VICRYL 0 TIES 12 18 (SUTURE) IMPLANT
SUT VICRYL 0 UR6 27IN ABS (SUTURE) IMPLANT
TOWEL OR 17X26 10 PK STRL BLUE (TOWEL DISPOSABLE) ×1 IMPLANT
TOWEL OR NON WOVEN STRL DISP B (DISPOSABLE) ×1 IMPLANT
TRAY LAPAROSCOPIC (CUSTOM PROCEDURE TRAY) ×1 IMPLANT
TROCAR 11X100 Z THREAD (TROCAR) IMPLANT
TROCAR BALLN 12MMX100 BLUNT (TROCAR) ×1 IMPLANT
TROCAR Z-THREAD OPTICAL 5X100M (TROCAR) ×1 IMPLANT

## 2022-11-30 NOTE — Interval H&P Note (Signed)
History and Physical Interval Note:  11/30/2022 7:29 AM  April Sutton  has presented today for surgery, with the diagnosis of BILIARY DYSKINESIA.  The various methods of treatment have been discussed with the patient and family. After consideration of risks, benefits and other options for treatment, the patient has consented to  Procedure(s): LAPAROSCOPIC CHOLECYSTECTOMY WITH ICG DYE (N/A) as a surgical intervention.  The patient's history has been reviewed, patient examined, no change in status, stable for surgery.  I have reviewed the patient's chart and labs.  Questions were answered to the patient's satisfaction.     [name]

## 2022-11-30 NOTE — Transfer of Care (Signed)
Immediate Anesthesia Transfer of Care Note  Patient: April Sutton  Procedure(s) Performed: LAPAROSCOPIC CHOLECYSTECTOMY WITH ICG DYE  Patient Location: PACU  Anesthesia Type:General  Level of Consciousness: drowsy  Airway & Oxygen Therapy: Patient Spontanous Breathing and Patient connected to face mask oxygen  Post-op Assessment: Report given to RN and Post -op Vital signs reviewed and stable  Post vital signs: Reviewed and stable  Last Vitals:  Vitals Value Taken Time  BP 129/94 11/30/22 0910  Temp    Pulse 61 11/30/22 0912  Resp 18 11/30/22 0912  SpO2 100 % 11/30/22 0912  Vitals shown include unvalidated device data.  Last Pain:  Vitals:   11/30/22 0603  TempSrc: Oral  PainSc: 0-No pain         Complications: No notable events documented.

## 2022-11-30 NOTE — Anesthesia Postprocedure Evaluation (Signed)
Anesthesia Post Note  Patient: April Sutton  Procedure(s) Performed: LAPAROSCOPIC CHOLECYSTECTOMY WITH ICG DYE     Patient location during evaluation: PACU Anesthesia Type: General Level of consciousness: awake and alert Pain management: pain level controlled Vital Signs Assessment: post-procedure vital signs reviewed and stable Respiratory status: spontaneous breathing, nonlabored ventilation and respiratory function stable Cardiovascular status: blood pressure returned to baseline Postop Assessment: no apparent nausea or vomiting Anesthetic complications: no   No notable events documented.  Last Vitals:  Vitals:   11/30/22 0930 11/30/22 0945  BP: (!) 142/77 (!) 144/85  Pulse: 64 (!) 56  Resp: 20 17  Temp:    SpO2: 98% 94%    Last Pain:  Vitals:   11/30/22 0910  TempSrc:   PainSc: Asleep                 Marthenia Rolling

## 2022-11-30 NOTE — H&P (Signed)
REFERRING PHYSICIAN: Dawayne Cirri, PA  PROVIDER: Massie Mees Leanne Chang, MD  MRN: [id number] DOB: [birth year] DATE OF ENCOUNTER: 11/18/2022  Subjective   Chief Complaint: New Consultation (Discuss Gallbladder Surgery )   History of Present Illness: April Sutton is a 47 y.o. female who is seen today as an office consultation at the request of Dr. Donna Christen for evaluation of New Consultation (Discuss Gallbladder Surgery ) .   SHe presented to [facility] on January 23 with complaints of upper abdominal pain, nausea, vomiting, diarrhea which she initially thought was a stomach virus. Symptoms improved within about 2 days but since then she had complained of persistent epigastric pain and nausea. Also complained of increasing upper abdominal pain after and when she eats. With pain radiating to the back. She also complains of dark stools.  Wake up and will the night with the pain. Her diarrhea had resolved. She saw her GI doctor and they put her on Protonix but then her loose stools returned. And the pain returned in her upper abdomen and got worse. That is when they ordered the ultrasound which showed gallbladder sludge. In the interim they had also put her on Carafate which really did not help that much. She then switched to a bland diet and that is when her pain significantly improved and essentially resolved. When she would have the upper abdominal pain it would last for a few hours. She did have some black stools at times but she was also taking Pepto-Bismol. She most recently underwent an upper endoscopy on Monday which was within normal limits the only thing they saw was perhaps a tiny small hiatal hernia. There is no evidence of gastritis or ulcer.    Review of Systems: A complete review of systems was obtained from the patient. I have reviewed this information and discussed as appropriate with the patient. See HPI as well for other ROS.  ROS   Medical History: Past Medical History:   Diagnosis Date  Arthritis  GERD (gastroesophageal reflux disease)   There is no problem list on file for this patient.  Past Surgical History:  Procedure Laterality Date  LAPAROSCOPY DIAGNOSTIC 02/2003  HYSTERECTOMY 10/2016    Allergies  Allergen Reactions  Sumatriptan Headache and Other (See Comments)  Adverse reaction-- "made pain 10 times worse"  Latex, Natural Rubber Hives and Rash   Current Outpatient Medications on File Prior to Visit  Medication Sig Dispense Refill  aspirin-acetaminophen-caffeine (EXCEDRIN MIGRAINE) 250-250-65 mg per tablet Take by mouth  butalbital-acetaminophen-caffeine (FIORICET) 50-325-40 mg tablet Take by mouth  calcium carbonate (CALCIUM 500 ORAL) Take by mouth  estradioL (CLIMARA) 0.05 mg/24 hr patch APPLY 1 PATCH BY TRANSDERMAL ROUTE EVERY WEEK  Lactobacillus acidophilus (PROBIOTIC ORAL) Take by mouth  multivitamin tablet Take 1 tablet by mouth once daily  pantoprazole (PROTONIX) 40 MG DR tablet Take 40 mg by mouth once daily  sucralfate (CARAFATE) 1 gram tablet 1 TABLET ON AN EMPTY STOMACH ORALLY THREE TIMES A DAY BEFORE MEALS 30 DAY(S)   No current facility-administered medications on file prior to visit.   No family history on file.   Social History   Tobacco Use  Smoking Status Never  Smokeless Tobacco Never    Social History   Socioeconomic History  Marital status: Married  Tobacco Use  Smoking status: Never  Smokeless tobacco: Never  Substance and Sexual Activity  Alcohol use: Not Currently  Drug use: Never   Objective:   Vitals:  11/18/22 0855  BP: 123/80  Pulse: 68  Temp: 36.6 C (97.9 F)  SpO2: 99%  Weight: 93.3 kg (205 lb 9.6 oz)  Height: 158.8 cm (5' 2.5")   Body mass index is 37.01 kg/m.  Constitutional: NAD; conversant; no deformities Eyes: Moist conjunctiva; no lid lag; anicteric; PERRL Neck: Trachea midline; no thyromegaly Lungs: Normal respiratory effort; no tactile fremitus CV: RRR; no palpable  thrills; no pitting edema GI: Abd soft, nontender, nondistended; no palpable hepatosplenomegaly MSK: Normal gait; no clubbing/cyanosis Psychiatric: Appropriate affect; alert and oriented x3 Lymphatic: No palpable cervical or axillary lymphadenopathy Skin: No rash, lesions or jaundice  Labs, Imaging and Diagnostic Testing: [facility] note 11/03/22  Labs on January 23 revealed a normal lipase, Comprehensive metabolic panel showed an elevated ALT of 66 otherwise normal. CBC showed a white count of 16.4, hemoglobin 13, hematocrit 40, platelet count 278  Abdominal ultrasound January 23 showed sludge in the gallbladder, no evidence of gallbladder infection, normal liver, normal common bile duct. Normal pancreas and kidneys  ED note November 05, 2022  CT abdomen pelvis November 06, 2022 IMPRESSION: 1. No acute intra-abdominal or pelvic pathology. 2. No bowel obstruction. Normal appendix. 3. Aortic Atherosclerosis (ICD10-I70.0).  Upper endoscopy from November 16, 2022-small hiatal hernia, normal stomach, normal duodenum Assessment and Plan:    Diagnoses and all orders for this visit:  Biliary dyskinesia    Since the patient has had a normal upper endoscopy, I believe the patient's symptoms are consistent with gallbladder disease.  We discussed gallbladder disease. The patient was given Neurosurgeon. We discussed non-operative and operative management. We discussed the signs & symptoms of acute cholecystitis  I discussed laparoscopic cholecystectomy with possible IOC in detail. The patient was given educational material as well as diagrams detailing the procedure. We discussed the risks and benefits of a laparoscopic cholecystectomy including, but not limited to bleeding, infection, injury to surrounding structures such as the intestine or liver, bile leak, retained gallstones, need to convert to an open procedure, prolonged diarrhea, blood clots such as DVT, common bile duct injury,  anesthesia risks, and possible need for additional procedures. We discussed the typical post-operative recovery course. I explained that the likelihood of improvement of their symptoms is good.  Do not think we necessarily need to repeat her blood work today even though she had had a leukocytosis a few weeks ago. We will recheck her labs at her preoperative appointment. She has doing well. She is tolerating a diet. Her vital signs are stable and she has no abdominal pain.  This patient encounter took 45 minutes today to perform the following: take history, perform exam, review outside records, interpret imaging, counsel the patient on their diagnosis and document encounter, findings & plan in the EHR  No follow-ups on file.  Fabiana Dromgoole Leanne Chang, MD  General, Minimally Invasive, & Bariatric Surgery

## 2022-11-30 NOTE — Discharge Instructions (Signed)
[facility] LAPAROSCOPIC SURGERY: POST OP INSTRUCTIONS Always review your discharge instruction sheet given to you by the facility where your surgery was performed. IF YOU HAVE DISABILITY OR FAMILY LEAVE FORMS, YOU MUST BRING THEM TO THE OFFICE FOR PROCESSING.   DO NOT GIVE THEM TO YOUR DOCTOR.  PAIN CONTROL  First take acetaminophen (Tylenol) AND/or ibuprofen (Advil) to control your pain after surgery.  Follow directions on package.  Taking acetaminophen (Tylenol) and/or ibuprofen (Advil) regularly after surgery will help to control your pain and lower the amount of prescription pain medication you may need.  You should not take more than 3,000 mg (3 grams) of acetaminophen (Tylenol) in 24 hours.  You should not take ibuprofen (Advil), aleve, motrin, naprosyn or other NSAIDS if you have a history of stomach ulcers or chronic kidney disease.  A prescription for pain medication may be given to you upon discharge.  Take your pain medication as prescribed, if you still have uncontrolled pain after taking acetaminophen (Tylenol) or ibuprofen (Advil). Use ice packs to help control pain. If you need a refill on your pain medication, please contact your pharmacy.  They will contact our office to request authorization. Prescriptions will not be filled after 5pm or on week-ends.  HOME MEDICATIONS Take your usually prescribed medications unless otherwise directed.  DIET You should follow a light diet the first few days after arrival home.  Be sure to include lots of fluids daily. Avoid fatty, fried foods.   CONSTIPATION It is common to experience some constipation after surgery and if you are taking pain medication.  Increasing fluid intake and taking a stool softener (such as Colace) will usually help or prevent this problem from occurring.  A mild laxative (Milk of Magnesia or Miralax) should be taken according to package instructions if there are no bowel movements after 48  hours.  WOUND/INCISION CARE Most patients will experience some swelling and bruising in the area of the incisions.  Ice packs will help.  Swelling and bruising can take several days to resolve.  Unless discharge instructions indicate otherwise, follow guidelines below  STERI-STRIPS - you may remove your outer bandages 48 hours after surgery, and you may shower at that time.  You have steri-strips (small skin tapes) in place directly over the incision.  These strips should be left on the skin for 7-10 days.   DERMABOND/SKIN GLUE - you may shower in 24 hours.  The glue will flake off over the next 2-3 weeks. Any sutures or staples will be removed at the office during your follow-up visit.  ACTIVITIES You may resume regular (light) daily activities beginning the next day--such as daily self-care, walking, climbing stairs--gradually increasing activities as tolerated.  You may have sexual intercourse when it is comfortable.  Refrain from any heavy lifting or straining until approved by your doctor. You may drive when you are no longer taking prescription pain medication, you can comfortably wear a seatbelt, and you can safely maneuver your car and apply brakes.  FOLLOW-UP You should see your doctor in the office for a follow-up appointment approximately 2-3 weeks after your surgery.  You should have been given your post-op/follow-up appointment when your surgery was scheduled.  If you did not receive a post-op/follow-up appointment, make sure that you call for this appointment within a day or two after you arrive home to insure a convenient appointment time.  OTHER INSTRUCTIONS   WHEN TO CALL YOUR DOCTOR: Fever over 101.0 Inability to urinate Continued   bleeding from incision. Increased pain, redness, or drainage from the incision. Increasing abdominal pain  The clinic staff is available to answer your questions during regular business hours.  Please don't hesitate to call and ask to speak to  one of the nurses for clinical concerns.  If you have a medical emergency, go to the nearest emergency room or call 911.  A surgeon from [facility] is always on call at the hospital. [address], [address], [place], [place]  [address] ? [address] [address], [place], [place]   [address] [phone] ? [phone] ? FAX [phone] Web site: [email or web address]  

## 2022-11-30 NOTE — Op Note (Signed)
APSARA ONAN [id number] [birth year] 11/30/2022  Laparoscopic Cholecystectomy with near infrared fluorescent cholangiography procedure Note  Indications: This patient presents with symptomatic gallbladder disease and will undergo laparoscopic cholecystectomy.  Pre-operative Diagnosis: biliary dyskinesia  Post-operative Diagnosis: Same  Surgeon: [name] MD FACS  Assistants: Carlena Hurl PA-C  Anesthesia: General endotracheal anesthesia  Procedure Details  The patient was seen again in the Holding Room. The risks, benefits, complications, treatment options, and expected outcomes were discussed with the patient. The possibilities of reaction to medication, pulmonary aspiration, perforation of viscus, bleeding, recurrent infection, finding a normal gallbladder, the need for additional procedures, failure to diagnose a condition, the possible need to convert to an open procedure, and creating a complication requiring transfusion or operation were discussed with the patient. The likelihood of improving the patient's symptoms with return to their baseline status is good.  The patient and/or family concurred with the proposed plan, giving informed consent. The site of surgery properly noted. The patient was taken to Operating Room, identified as Venita Lick and the procedure verified as Laparoscopic Cholecystectomy with ICG dye.  A Time Out was held and the above information confirmed. Antibiotic prophylaxis was administered.    ICG dye was administered preoperatively.    General endotracheal anesthesia was then administered and tolerated well. After the induction, the abdomen was prepped with Chloraprep and draped in the sterile fashion. The patient was positioned in the supine position.  Local anesthetic agent was injected into the skin near the umbilicus and an incision made. We dissected down to the abdominal fascia with blunt dissection.  The fascia was incised vertically and we entered  the peritoneal cavity bluntly.  A pursestring suture of 0-Vicryl was placed around the fascial opening.  The Hasson cannula was inserted and secured with the stay suture.  Pneumoperitoneum was then created with CO2 and tolerated well without any adverse changes in the patient's vital signs. An 5-mm port was placed in the subxiphoid position.  Two 5-mm ports were placed in the right upper quadrant. All skin incisions were infiltrated with a local anesthetic agent before making the incision and placing the trocars.   We positioned the patient in reverse Trendelenburg, tilted slightly to the patient's left.  The gallbladder was identified, the fundus grasped and retracted cephalad. Adhesions were lysed bluntly and with the electrocautery where indicated, taking care not to injure any adjacent organs or viscus. The infundibulum (which was encased in fat tissue) was grasped and retracted laterally, exposing the peritoneum overlying the triangle of Calot. This was then divided and exposed in a blunt fashion. A critical view of the cystic duct and cystic artery was obtained.  The cystic duct was clearly identified and bluntly dissected circumferentially.  Utilizing the Stryker camera system near infrared fluorescent activity was visualized in the liver, cystic duct, common hepatic duct and common bile duct.  This served as a secondary confirmation of our anatomy.  The cystic duct was then ligated with clips and divided. The cystic artery which had been identified & dissected free was ligated with clips and divided as well.   The gallbladder was dissected from the liver bed in retrograde fashion with the electrocautery.  There was some spillage of bile from the gallbladder as it was mobilized off of the liver.  The gallbladder was removed and placed in an Ecco sac.  The gallbladder and Ecco sac were then removed through the umbilical port site. The liver bed was irrigated and inspected. Hemostasis was achieved with  the electrocautery. Copious irrigation was utilized and was repeatedly aspirated until clear.  The pursestring suture was used to close the umbilical fascia.  There was an area of weakness superior to my pursestring sutures so I placed an additional interrupted 0 Vicryl with the PMI suture passer.  There was a little bit of oozing from the abdominal wall at the umbilical fascial closure.  I ended up placing an additional 0 Vicryl on a UR 5 needle through the fascia through this area of using.  There was hemostasis at this point.  We again inspected the right upper quadrant for hemostasis.  The umbilical closure was inspected and there was no air leak and nothing trapped within the closure. Pneumoperitoneum was released as we removed the trocars.  4-0 Monocryl was used to close the skin.   Dermabond was applied. The patient was then extubated and brought to the recovery room in stable condition. Instrument, sponge, and needle counts were correct at closure and at the conclusion of the case.   Findings: Positive critical view, Fluorescent activity seen within the common hepatic duct, common bile duct, cystic duct and gallbladder and liver.  Estimated Blood Loss: Minimal         Drains: none         Specimens: Gallbladder           Complications: None; patient tolerated the procedure well.         Disposition: PACU - hemodynamically stable.         Condition: stable  Leighton Ruff. Redmond Pulling, MD, FACS General, Bariatric, & Minimally Invasive Surgery [facility],  [place]

## 2022-11-30 NOTE — Anesthesia Preprocedure Evaluation (Signed)
Anesthesia Evaluation  Patient identified by MRN, date of birth, ID band Patient awake    Reviewed: Allergy & Precautions, NPO status , Patient's Chart, lab work & pertinent test results  History of Anesthesia Complications (+) PONV and history of anesthetic complications  Airway Mallampati: II  TM Distance: >3 FB Neck ROM: Full    Dental no notable dental hx.    Pulmonary neg pulmonary ROS   Pulmonary exam normal        Cardiovascular negative cardio ROS Normal cardiovascular exam     Neuro/Psych  Headaches  negative psych ROS   GI/Hepatic Neg liver ROS,GERD  Medicated,,BILIARY DYSKINESIA   Endo/Other  negative endocrine ROS    Renal/GU negative Renal ROS  negative genitourinary   Musculoskeletal negative musculoskeletal ROS (+)    Abdominal   Peds  Hematology negative hematology ROS (+)   Anesthesia Other Findings Day of surgery medications reviewed with patient.  Reproductive/Obstetrics negative OB ROS                              Anesthesia Physical Anesthesia Plan  ASA: 2  Anesthesia Plan: General   Post-op Pain Management: Tylenol PO (pre-op)* and Toradol IV (intra-op)*   Induction: Intravenous  PONV Risk Score and Plan: 4 or greater and Treatment may vary due to age or medical condition, Ondansetron, Dexamethasone, Midazolam, Scopolamine patch - Pre-op, Propofol infusion and TIVA  Airway Management Planned: Oral ETT  Additional Equipment: None  Intra-op Plan:   Post-operative Plan: Extubation in OR  Informed Consent: I have reviewed the patients History and Physical, chart, labs and discussed the procedure including the risks, benefits and alternatives for the proposed anesthesia with the patient or authorized representative who has indicated his/her understanding and acceptance.     Dental advisory given  Plan Discussed with: CRNA  Anesthesia Plan Comments:           Anesthesia Quick Evaluation

## 2022-11-30 NOTE — Anesthesia Procedure Notes (Signed)
Procedure Name: Intubation Date/Time: 11/30/2022 7:44 AM  Performed by: West Pugh, CRNAPre-anesthesia Checklist: Patient identified, Emergency Drugs available, Suction available, Patient being monitored and Timeout performed Patient Re-evaluated:Patient Re-evaluated prior to induction Oxygen Delivery Method: Circle system utilized Preoxygenation: Pre-oxygenation with 100% oxygen Induction Type: IV induction Ventilation: Mask ventilation without difficulty Laryngoscope Size: Mac and 3 Grade View: Grade I Tube type: Oral Tube size: 7.0 mm Number of attempts: 1 Airway Equipment and Method: Stylet Placement Confirmation: ETT inserted through vocal cords under direct vision, positive ETCO2, CO2 detector and breath sounds checked- equal and bilateral Secured at: 22 cm Tube secured with: Tape Dental Injury: Teeth and Oropharynx as per pre-operative assessment

## 2022-12-01 ENCOUNTER — Encounter (HOSPITAL_COMMUNITY): Payer: Self-pay | Admitting: General Surgery

## 2022-12-01 LAB — SURGICAL PATHOLOGY

## 2024-02-10 ENCOUNTER — Encounter (HOSPITAL_BASED_OUTPATIENT_CLINIC_OR_DEPARTMENT_OTHER): Payer: Self-pay

## 2024-02-10 ENCOUNTER — Emergency Department (HOSPITAL_BASED_OUTPATIENT_CLINIC_OR_DEPARTMENT_OTHER)
Admission: EM | Admit: 2024-02-10 | Discharge: 2024-02-10 | Disposition: A | Discharge status: Home / Self Care | Attending: Emergency Medicine | Admitting: Emergency Medicine

## 2024-02-10 ENCOUNTER — Emergency Department (HOSPITAL_BASED_OUTPATIENT_CLINIC_OR_DEPARTMENT_OTHER): Admitting: Radiology

## 2024-02-10 ENCOUNTER — Other Ambulatory Visit: Payer: Self-pay

## 2024-02-10 DIAGNOSIS — Z91040 Latex allergy status: Secondary | ICD-10-CM | POA: Diagnosis not present

## 2024-02-10 DIAGNOSIS — S99921A Unspecified injury of right foot, initial encounter: Secondary | ICD-10-CM | POA: Diagnosis present

## 2024-02-10 DIAGNOSIS — Y9389 Activity, other specified: Secondary | ICD-10-CM | POA: Diagnosis not present

## 2024-02-10 DIAGNOSIS — W2203XA Walked into furniture, initial encounter: Secondary | ICD-10-CM | POA: Diagnosis not present

## 2024-02-10 DIAGNOSIS — S92511A Displaced fracture of proximal phalanx of right lesser toe(s), initial encounter for closed fracture: Secondary | ICD-10-CM | POA: Diagnosis not present

## 2024-02-10 NOTE — ED Provider Notes (Signed)
 [place] EMERGENCY DEPARTMENT AT [facility] Provider Note   CSN: [id number] Arrival date & time: 02/10/24  1715     History  Chief Complaint  Patient presents with   Toe Injury    April Sutton is a 48 y.o. female.  HPI   48 year old female presents emergency department with complaints of toe pain on her right foot.  Patient states that she was running playing hide and go seek with her dog.  States that she accidentally ran into a piece of furniture.  Reports having pain in multiple of the toes on her right foot.  Denies pain/trauma elsewhere.  Denies subsequently following.  Has taken no medication for symptoms.  Past medical history significant for anemia, endometriosis, migraine, cholecystitis with cholecystectomy  Home Medications Prior to Admission medications   Medication Sig Start Date End Date Taking? Authorizing Provider  aspirin-acetaminophen -caffeine (EXCEDRIN MIGRAINE) 250-250-65 MG tablet Take 1 tablet by mouth every 6 (six) hours as needed for headache or migraine.    [provider]  BLACK ELDERBERRY PO Take 400 mg by mouth daily.    [provider]  butalbital-acetaminophen -caffeine (FIORICET, ESGIC) 50-325-40 MG tablet Take 1 tablet by mouth 2 (two) times daily as needed for headache or migraine.     [provider]  Calcium Citrate-Vitamin D (CALCIUM + D PO) Take 1 tablet by mouth daily.    [provider]  estradiol  (CLIMARA  - DOSED IN MG/24 HR) 0.05 mg/24hr patch Place 0.05 mg onto the skin every Monday.    [provider]  ketoconazole (NIZORAL) 2 % cream Apply 1 Application topically daily as needed (rash).    [provider]  Multiple Vitamin (MULTIVITAMIN) tablet Take 1 tablet by mouth daily.    [provider]  ondansetron  (ZOFRAN -ODT) 8 MG disintegrating tablet Take 1 tablet (8 mg total) by mouth every 8 (eight) hours as needed for nausea or vomiting. 11/06/22   Alissa April, MD   oxyCODONE  (ROXICODONE ) 5 MG immediate release tablet Take 1 tablet (5 mg total) by mouth every 6 (six) hours as needed for severe pain. 11/30/22   Aldean Hummingbird, MD  pantoprazole (PROTONIX) 40 MG tablet Take 40 mg by mouth daily. 10/30/22   [provider]  Probiotic Product (PROBIOTIC PO) Take 1 capsule by mouth daily.    [provider]      Allergies    Imitrex [sumatriptan] and Latex    Review of Systems   Review of Systems  All other systems reviewed and are negative.   Physical Exam Updated Vital Signs BP (!) 141/80 (BP Location: Right Arm)   Pulse 68   Temp 98.1 F (36.7 C) (Oral)   Resp 18   Ht 5' 2.5" (1.588 m)   Wt 96.6 kg   LMP 10/23/2016 (Approximate) Comment: w/ bilat oophorectomies//a.c.  SpO2 99%   BMI 38.34 kg/m  Physical Exam Vitals and nursing note reviewed.  Constitutional:      General: She is not in acute distress.    Appearance: She is well-developed.  HENT:     Head: Normocephalic and atraumatic.  Eyes:     Conjunctiva/sclera: Conjunctivae normal.  Cardiovascular:     Rate and Rhythm: Normal rate and regular rhythm.     Heart sounds: No murmur heard. Pulmonary:     Effort: Pulmonary effort is normal. No respiratory distress.     Breath sounds: Normal breath sounds.  Abdominal:     Palpations: Abdomen is soft.  Tenderness: There is no abdominal tenderness.  Musculoskeletal:        General: No swelling.     Cervical back: Neck supple.     Comments: Full range of motion ankle digits of right foot.  Tenderness to palpation digits 3 through 5 right foot.  Pedal pulses, posterior tibial pulses 2+ bilaterally.  Cap refill less than 2 seconds.  No other reproducible tenderness right ankle/foot.  Skin:    General: Skin is warm and dry.     Capillary Refill: Capillary refill takes less than 2 seconds.  Neurological:     Mental Status: She is alert.  Psychiatric:        Mood and Affect: Mood normal.     ED Results / Procedures  / Treatments   Labs (all labs ordered are listed, but only abnormal results are displayed) Labs Reviewed - No data to display  EKG None  Radiology DG Foot Complete Right Result Date: 02/10/2024 CLINICAL DATA:  Right foot injury. EXAM: RIGHT FOOT COMPLETE - 3+ VIEW COMPARISON:  None Available. FINDINGS: Acute comminuted fracture of the third proximal phalanx head with intra-articular extension to the third PIP joint. There is approximately 2 mm of lateral displacement of the distal fracture component. Acute intra-articular fracture at the medial base of fourth proximal phalanx with approximately 2 mm of lateral displacement of the distal fracture component. Acute intra-articular fracture at the medial base of fifth proximal phalanx with approximately 2 mm of displacement. No dislocation.  Soft tissue swelling of the forefoot. IMPRESSION: 1. Acute intra-articular, comminuted and mildly displaced fracture of third proximal phalanx head. 2. Acute intra-articular mildly displaced fracture of the medial base of the fourth proximal phalanx. 3. Acute intra-articular mildly displaced fracture of the medial base of fifth proximal phalanx. Electronically Signed   By: Mannie Seek M.D.   On: 02/10/2024 18:14    Procedures Procedures    Medications Ordered in ED Medications - No data to display  ED Course/ Medical Decision Making/ A&P                                 Medical Decision Making  This patient presents to the ED for concern of toe pain, this involves an extensive number of treatment options, and is a complaint that carries with it a high risk of complications and morbidity.  The differential diagnosis includes fracture, strain/pain, dislocation, ligamentous/tendinous injury, neurovascular compromise, paronychia, felon, ischemic digit, other   Co morbidities that complicate the patient evaluation  See HPI   Additional history obtained:  Additional history obtained from  EMR External records from outside source obtained and reviewed including hospital records   Lab Tests:  N/a   Imaging Studies ordered:  I ordered imaging studies including right foot x-ray I independently visualized and interpreted imaging which showed acute intra-articular comminuted nondisplaced fracture third proximal phalanx head.  Acute intra-articular myiasis fracture medial base of fourth proximal phalanx.  Acute intra-articular nondisplaced fracture medial base fifth proximal phalanx. I agree with the radiologist interpretation  Cardiac Monitoring: / EKG:  N/a   Consultations Obtained:  N/a   Problem List / ED Course / Critical interventions / Medication management  Right toe pain Reevaluation of the patient showed that the patient stayed the same I have reviewed the patients home medicines and have made adjustments as needed   Social Determinants of Health:  Denies tobacco, illicit drug use.   Test /  Admission - Considered:  Fracture right toes Vitals signs significant for hypertension blood pressure 141/80. Otherwise within normal range and stable throughout visit. Imaging studies significant for: See above 48 year old female presents emergency department with complaints of toe pain on her right foot.  Patient states that she was running playing hide and go seek with her dog.  States that she accidentally ran into a piece of furniture.  Reports having pain in multiple of the toes on her right foot.  Denies pain/trauma elsewhere.  Denies subsequently following.  Has taken no medication for symptoms. On exam, tenderness along third through fifth digits of right foot.  No pulse deficits suggest ischemic limb.  No overlying skin changes concerning for secondary infectious process.  X-ray obtained which concerning for fracture proximal phalanx third, 4th and 5th digits of right foot as above.  Partial reduction performed while in the ED with buddy taping of 2nd and 3rd  digits together given it appeared to be most displaced out of the tibial fracture was present..  Patient given postop shoe as well as crutches to help aid in ambulation.  Will recommend rest, ice, ice, NSAIDs and follow-up with Ortho.  Treatment plan discussed with patient and she acknowledged understanding was agreeable to said plan.  Patient overall well-appearing, afebrile in no acute distress. Worrisome signs and symptoms were discussed with the patient, and the patient acknowledged understanding to return to the ED if noticed. Patient was stable upon discharge.          Final Clinical Impression(s) / ED Diagnoses Final diagnoses:  Closed displaced fracture of proximal phalanx of lesser toe of right foot, initial encounter    Rx / DC Orders ED Discharge Orders     None          [place], [place] 02/10/24 Tennis Feinstein    Scarlette Currier, MD 02/11/24 1415

## 2024-02-10 NOTE — Discharge Instructions (Signed)
 As discussed, x-ray did not show evidence of fracture third toe on your right foot.  Recommend continue buddy taping your toes together.  You may wear the postop shoe as needed and use crutches as needed to help aid in ambulation.  Take Tylenol /Motrin  for pain.  Please do not hesitate to return if the worrisome signs and symptoms we discussed become apparent.

## 2024-02-10 NOTE — ED Triage Notes (Signed)
 Patient comes in with right foot pain and toe pain. She said she was running and playing hide and seek with the dog when she hit a solid wood bench. She states it goes across the whole top of the foot. Painful with movement. Warm to the touch, positive pulses, normal cap refill. No medications taken prior to arrival. Currently has ice pack to the foot.
